# Patient Record
Sex: Male | Born: 1965 | Race: White | Hispanic: No | Marital: Married | Smoking: Never smoker
Health system: Southern US, Community
[De-identification: ages and names within clinical notes are randomized; demographics above are authoritative.]

## PROBLEM LIST (undated history)

## (undated) DIAGNOSIS — I480 Paroxysmal atrial fibrillation: Secondary | ICD-10-CM

## (undated) DIAGNOSIS — N200 Calculus of kidney: Secondary | ICD-10-CM

## (undated) DIAGNOSIS — I77810 Thoracic aortic ectasia: Secondary | ICD-10-CM

## (undated) DIAGNOSIS — F419 Anxiety disorder, unspecified: Secondary | ICD-10-CM

## (undated) DIAGNOSIS — I4891 Unspecified atrial fibrillation: Secondary | ICD-10-CM

## (undated) DIAGNOSIS — I10 Essential (primary) hypertension: Secondary | ICD-10-CM

## (undated) DIAGNOSIS — I499 Cardiac arrhythmia, unspecified: Secondary | ICD-10-CM

## (undated) DIAGNOSIS — R072 Precordial pain: Secondary | ICD-10-CM

## (undated) DIAGNOSIS — M4800 Spinal stenosis, site unspecified: Secondary | ICD-10-CM

## (undated) DIAGNOSIS — M1990 Unspecified osteoarthritis, unspecified site: Secondary | ICD-10-CM

## (undated) DIAGNOSIS — R079 Chest pain, unspecified: Secondary | ICD-10-CM

## (undated) DIAGNOSIS — I5189 Other ill-defined heart diseases: Secondary | ICD-10-CM

## (undated) HISTORY — DX: Thoracic aortic ectasia: I77.810

## (undated) HISTORY — DX: Unspecified atrial fibrillation: I48.91

## (undated) HISTORY — PX: KNEE SURGERY: SHX244

## (undated) HISTORY — DX: Precordial pain: R07.2

## (undated) HISTORY — DX: Chest pain, unspecified: R07.9

## (undated) HISTORY — DX: Anxiety disorder, unspecified: F41.9

## (undated) HISTORY — DX: Spinal stenosis, site unspecified: M48.00

## (undated) HISTORY — DX: Unspecified osteoarthritis, unspecified site: M19.90

## (undated) HISTORY — DX: Paroxysmal atrial fibrillation: I48.0

## (undated) HISTORY — DX: Other ill-defined heart diseases: I51.89

## (undated) HISTORY — DX: Cardiac arrhythmia, unspecified: I49.9

## (undated) HISTORY — DX: Calculus of kidney: N20.0

## (undated) HISTORY — DX: Essential (primary) hypertension: I10

---

## 1998-04-27 ENCOUNTER — Inpatient Hospital Stay (HOSPITAL_COMMUNITY): Admission: RE | Admit: 1998-04-27 | Discharge: 1998-04-28 | Payer: Self-pay | Admitting: Specialist

## 1998-08-16 ENCOUNTER — Ambulatory Visit (HOSPITAL_COMMUNITY): Admission: RE | Admit: 1998-08-16 | Discharge: 1998-08-16 | Payer: Self-pay | Admitting: Specialist

## 1998-08-16 ENCOUNTER — Encounter: Payer: Self-pay | Admitting: Specialist

## 1998-08-31 ENCOUNTER — Encounter: Admission: RE | Admit: 1998-08-31 | Discharge: 1998-11-29 | Payer: Self-pay | Admitting: Anesthesiology

## 1998-11-06 HISTORY — PX: BACK SURGERY: SHX140

## 2001-10-11 ENCOUNTER — Emergency Department (HOSPITAL_COMMUNITY): Admission: EM | Admit: 2001-10-11 | Discharge: 2001-10-11 | Payer: Self-pay

## 2005-07-20 ENCOUNTER — Emergency Department: Payer: Self-pay | Admitting: Emergency Medicine

## 2005-07-20 ENCOUNTER — Other Ambulatory Visit: Payer: Self-pay

## 2006-03-12 ENCOUNTER — Emergency Department: Payer: Self-pay | Admitting: Emergency Medicine

## 2006-03-12 ENCOUNTER — Other Ambulatory Visit: Payer: Self-pay

## 2006-03-21 ENCOUNTER — Ambulatory Visit: Payer: Self-pay | Admitting: Internal Medicine

## 2006-03-21 ENCOUNTER — Observation Stay (HOSPITAL_COMMUNITY): Admission: EM | Admit: 2006-03-21 | Discharge: 2006-03-22 | Payer: Self-pay | Admitting: Emergency Medicine

## 2006-03-26 ENCOUNTER — Ambulatory Visit: Payer: Self-pay | Admitting: Internal Medicine

## 2006-03-26 ENCOUNTER — Ambulatory Visit: Payer: Self-pay

## 2006-04-03 ENCOUNTER — Ambulatory Visit: Payer: Self-pay | Admitting: Internal Medicine

## 2006-04-12 ENCOUNTER — Ambulatory Visit: Payer: Self-pay | Admitting: Internal Medicine

## 2006-05-29 ENCOUNTER — Encounter (HOSPITAL_COMMUNITY): Admission: RE | Admit: 2006-05-29 | Discharge: 2006-07-19 | Payer: Self-pay | Admitting: Internal Medicine

## 2006-06-05 ENCOUNTER — Ambulatory Visit: Payer: Self-pay | Admitting: Cardiology

## 2007-01-09 ENCOUNTER — Emergency Department (HOSPITAL_COMMUNITY): Admission: EM | Admit: 2007-01-09 | Discharge: 2007-01-10 | Payer: Self-pay | Admitting: Emergency Medicine

## 2008-03-07 IMAGING — CT CT CHEST W/ CM
2 of 3 series · 16 of 36 positions shown, 20 images · non-contrast
Comparison: none

HISTORY: Mediastinal mass, prior abnormal exam, hypertension

[Series 2: chest_rout_xxl 5.0 b31f st · axial · 0.89mm/px · z∈[-362,-88]mm · 13 of 63 slices shown, 17 images]
[im 5/63  mediastinal]
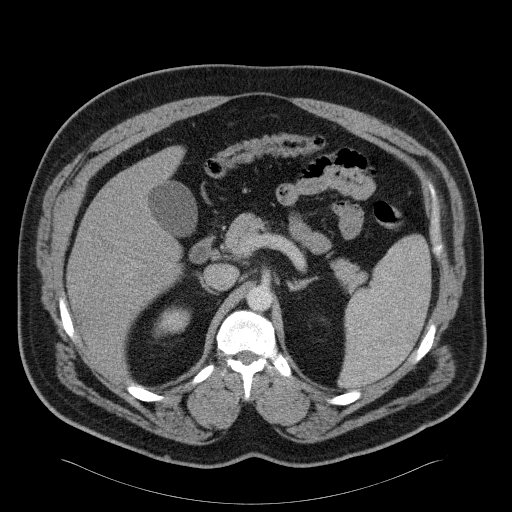
[im 5/63  lung]
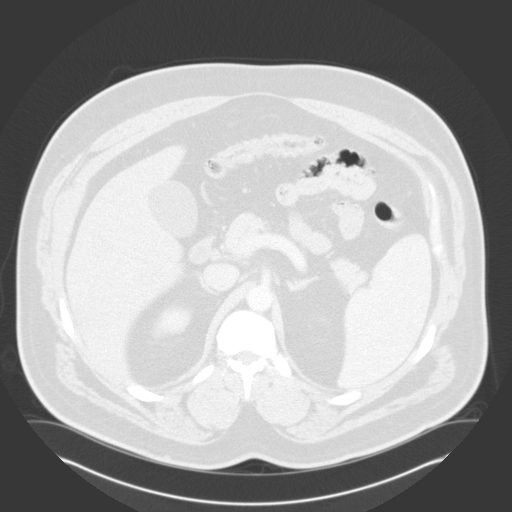
[im 10/63  lung]
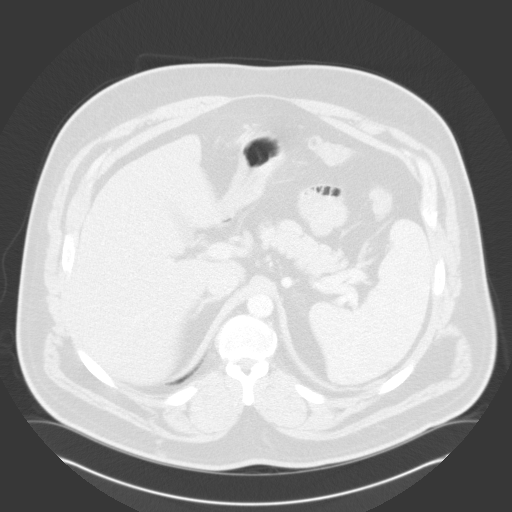
[im 14/63  lung]
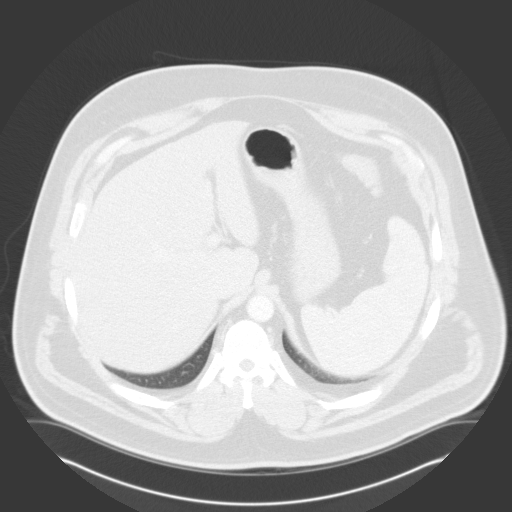
[im 19/63  lung]
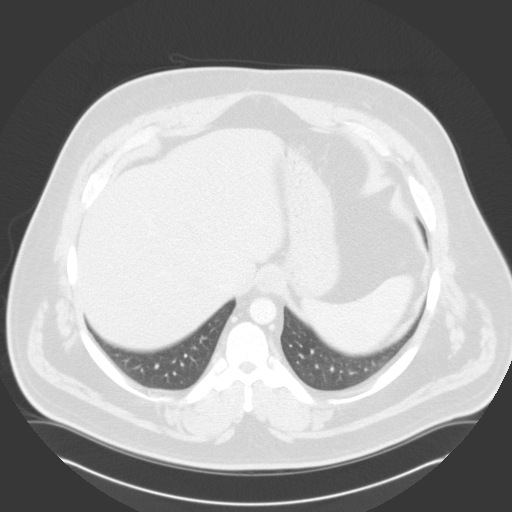
[im 23/63  mediastinal]
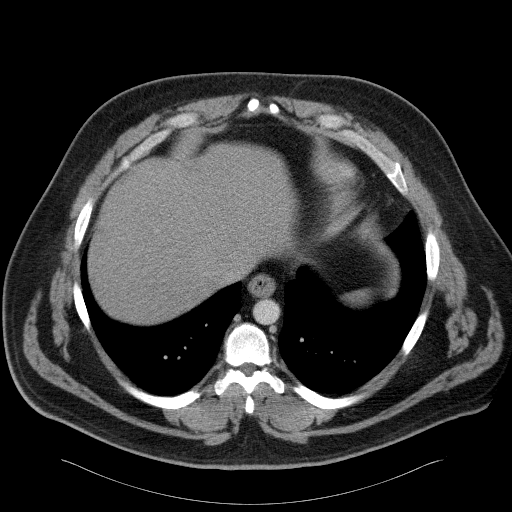
[im 23/63  lung]
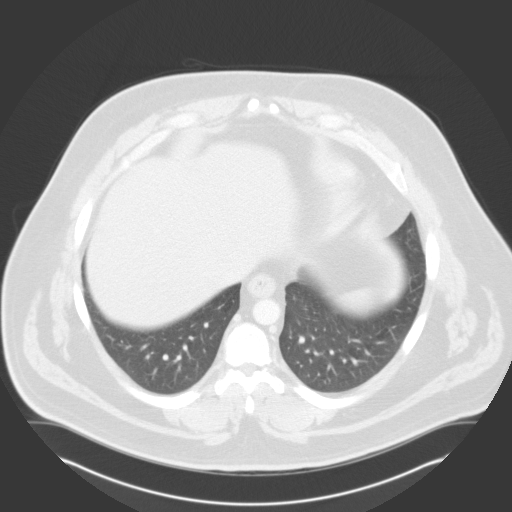
[im 28/63  lung]
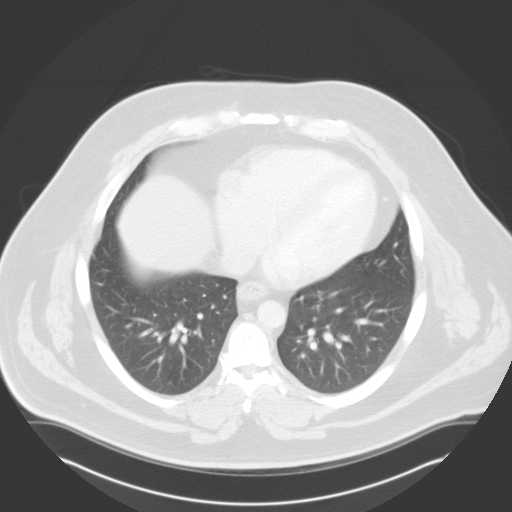
[im 33/63  lung]
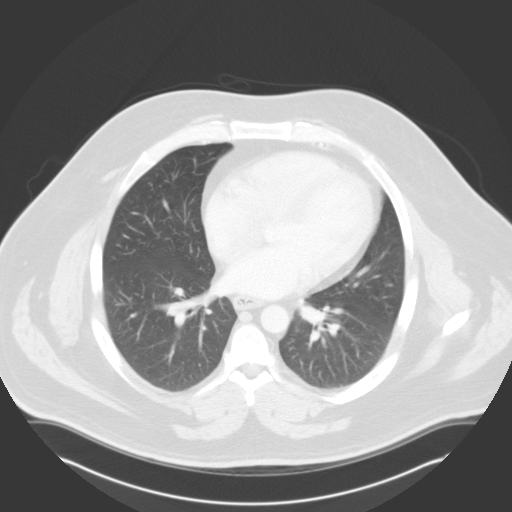
[im 37/63  lung]
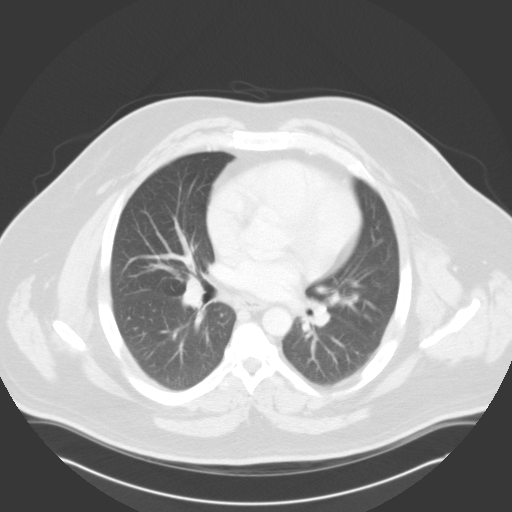
[im 42/63  mediastinal]
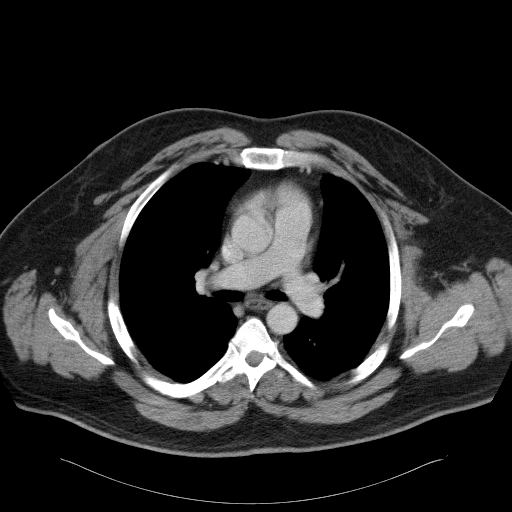
[im 42/63  lung]
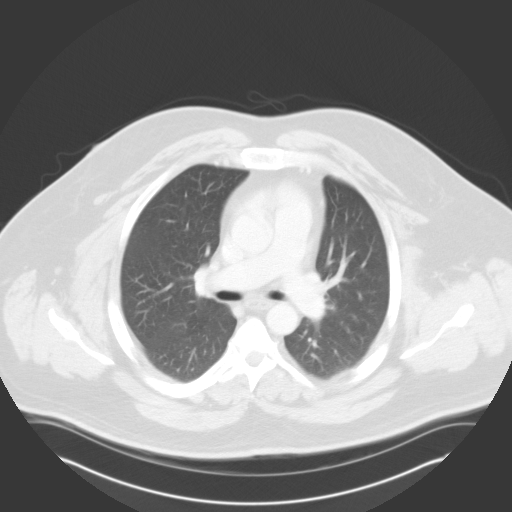
[im 46/63  lung]
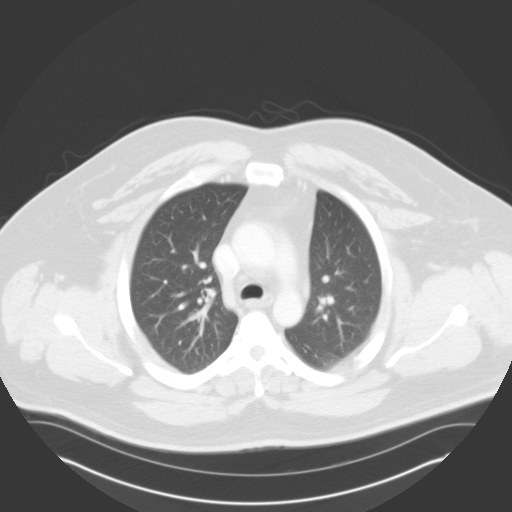
[im 51/63  lung]
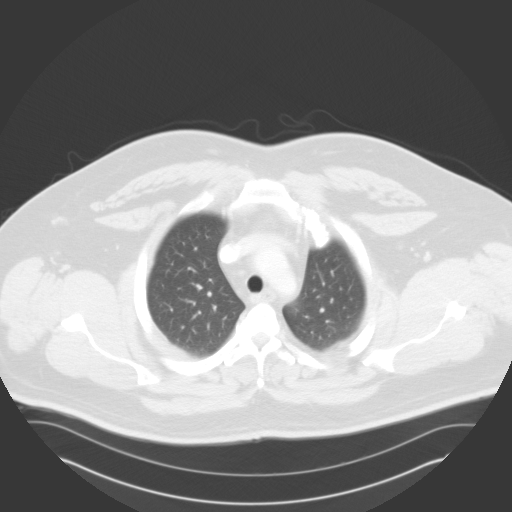
[im 56/63  lung]
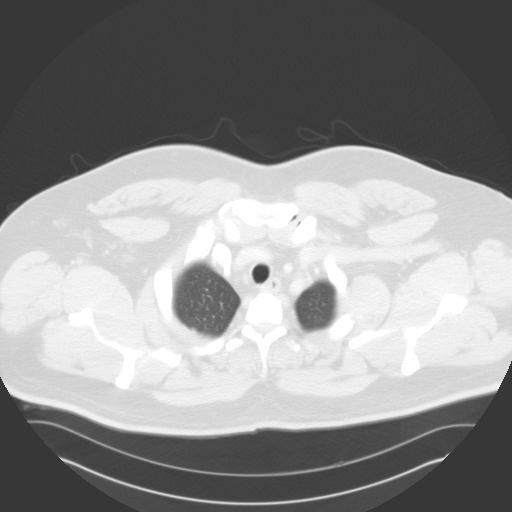
[im 60/63  mediastinal]
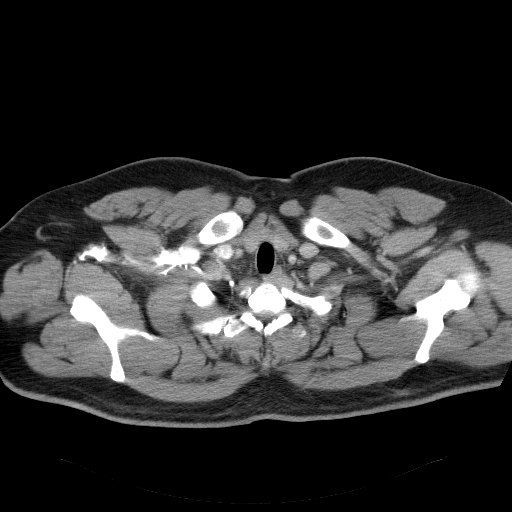
[im 60/63  lung]
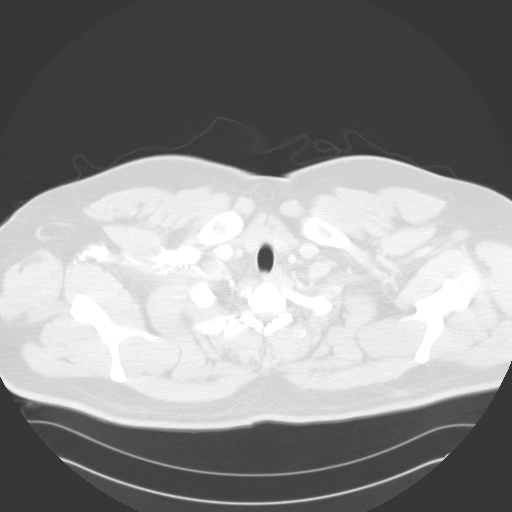

[Series 602: <mpr range> · coronal · 0.89mm/px · 3 of 39 slices shown]
[im 8/39  lung]
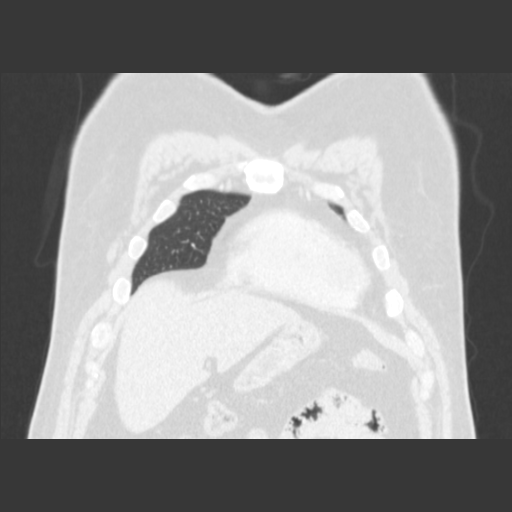
[im 16/39  lung]
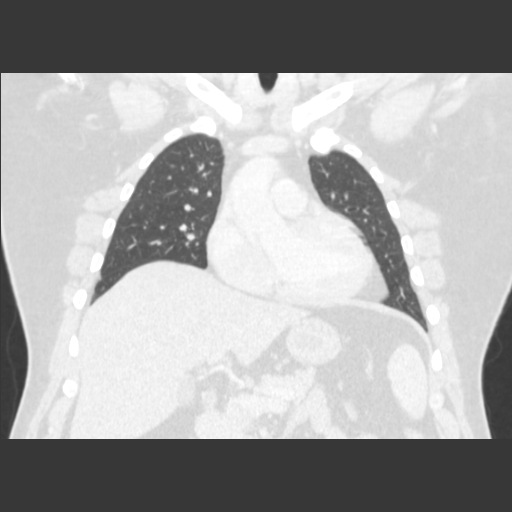
[im 23/39  lung]
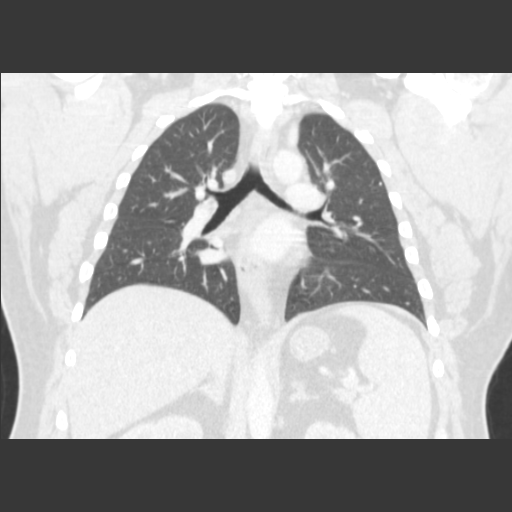

[16 of 36 positions shown; findings below may reference images not displayed]

CT CHEST WITH CONTRAST:

Multidetector helical CT imaging chest performed following 80 cc Imnipaque-O99.
No recent chest radiograph for correlation.
Exam correlated with earlier chest x-ray of 03/21/2006.
No prior CT chest for correlation.

Thoracic vascular structures grossly patent.
No thoracic adenopathy.
No evidence of mediastinal mass.
Lungs clear.
No mass, nausea, infiltrate, or pleural effusion.
Visualized portion of upper abdomen unremarkable though stomach and bowel loops
inadequately assessed due to lack of GI contrast.
IMPRESSION: No acute thoracic abnormalities.
If patient has a prior outside abnormal exam, recommend this be obtained for
direct correlation to better address the clinical concern.

## 2011-05-25 ENCOUNTER — Inpatient Hospital Stay: Payer: Self-pay | Admitting: Psychiatry

## 2011-06-02 ENCOUNTER — Emergency Department (HOSPITAL_COMMUNITY)
Admission: EM | Admit: 2011-06-02 | Discharge: 2011-06-03 | Disposition: A | Discharge status: Home / Self Care | Payer: Self-pay

## 2011-06-02 ENCOUNTER — Emergency Department (HOSPITAL_COMMUNITY): Payer: Self-pay

## 2011-06-02 DIAGNOSIS — M25569 Pain in unspecified knee: Secondary | ICD-10-CM | POA: Insufficient documentation

## 2011-06-02 DIAGNOSIS — R110 Nausea: Secondary | ICD-10-CM | POA: Insufficient documentation

## 2011-06-02 DIAGNOSIS — Y9389 Activity, other specified: Secondary | ICD-10-CM | POA: Insufficient documentation

## 2011-06-02 DIAGNOSIS — M545 Low back pain, unspecified: Secondary | ICD-10-CM | POA: Insufficient documentation

## 2011-06-02 DIAGNOSIS — M79609 Pain in unspecified limb: Secondary | ICD-10-CM | POA: Insufficient documentation

## 2011-06-02 DIAGNOSIS — M25519 Pain in unspecified shoulder: Secondary | ICD-10-CM | POA: Insufficient documentation

## 2011-06-02 DIAGNOSIS — M7989 Other specified soft tissue disorders: Secondary | ICD-10-CM | POA: Insufficient documentation

## 2011-06-02 DIAGNOSIS — S335XXA Sprain of ligaments of lumbar spine, initial encounter: Secondary | ICD-10-CM | POA: Insufficient documentation

## 2011-06-02 DIAGNOSIS — IMO0002 Reserved for concepts with insufficient information to code with codable children: Secondary | ICD-10-CM | POA: Insufficient documentation

## 2011-06-02 DIAGNOSIS — S60229A Contusion of unspecified hand, initial encounter: Secondary | ICD-10-CM | POA: Insufficient documentation

## 2011-06-02 DIAGNOSIS — Y9239 Other specified sports and athletic area as the place of occurrence of the external cause: Secondary | ICD-10-CM | POA: Insufficient documentation

## 2011-06-02 DIAGNOSIS — T148XXA Other injury of unspecified body region, initial encounter: Secondary | ICD-10-CM | POA: Insufficient documentation

## 2011-06-02 DIAGNOSIS — M25579 Pain in unspecified ankle and joints of unspecified foot: Secondary | ICD-10-CM | POA: Insufficient documentation

## 2011-06-02 DIAGNOSIS — R079 Chest pain, unspecified: Secondary | ICD-10-CM | POA: Insufficient documentation

## 2011-06-02 DIAGNOSIS — I10 Essential (primary) hypertension: Secondary | ICD-10-CM | POA: Insufficient documentation

## 2016-04-11 ENCOUNTER — Ambulatory Visit (INDEPENDENT_AMBULATORY_CARE_PROVIDER_SITE_OTHER): Payer: Self-pay | Admitting: Primary Care

## 2016-04-11 ENCOUNTER — Encounter: Payer: Self-pay | Admitting: Primary Care

## 2016-04-11 VITALS — BP 118/72 | HR 74 | Temp 97.9°F | Ht 67.5 in | Wt 278.1 lb

## 2016-04-11 DIAGNOSIS — L4050 Arthropathic psoriasis, unspecified: Secondary | ICD-10-CM | POA: Insufficient documentation

## 2016-04-11 DIAGNOSIS — I10 Essential (primary) hypertension: Secondary | ICD-10-CM | POA: Insufficient documentation

## 2016-04-11 DIAGNOSIS — Z791 Long term (current) use of non-steroidal anti-inflammatories (NSAID): Secondary | ICD-10-CM

## 2016-04-11 LAB — RHEUMATOID FACTOR: Rhuematoid fact SerPl-aCnc: <10 IU/mL (ref <=14)

## 2016-04-11 MED ORDER — MELOXICAM 15 MG PO TABS: 15.0000 mg | ORAL_TABLET | Freq: Every day | ORAL | Status: DC | PRN

## 2016-04-11 NOTE — Assessment & Plan Note (Addendum)
Diagnosed several years ago at [facility]. Exam with mild-moderate swelling to joints in hands and wrists. Inflammatory labs pending today. BMP pending. Meloxicam sent to pharmacy, discussed to stop other NSAIDS. Referral placed to rheumatology for further evaluation.

## 2016-04-11 NOTE — Progress Notes (Signed)
Subjective:    Patient ID: ODEN LINDAMAN, male    DOB: October 07, 1966, 50 y.o.   MRN: [id number]  HPI  Mr. Fei is a 50 year old male who presents today to establish care and discuss the problems mentioned below. Will obtain old records. His last physical was several years ago.  1) Essential Hypertension: Diagnosed 8-10 years ago. Currently managed on lisinopril-HCTZ 20/12.5 mg tablets. He denies chest pain, shortness of breath, dizziness.   2) Psoriatic Arthritis: Diagnosed years ago and once managed at [facility]. Pain located to neck, hands, wrists, knees, and plantar feet bilaterally. Over the past 3-4 months the pain to his knees and feet have been more bothersome. He also experiences swelling to his wrists and hands. His knee pain is located to the medial collateral ligaments of his knees.   He describes his pain to the plantar surface of his feet as tenderness and knotting. He's tried switching shoes, and is currently taking ibuprofen/aleve with improvement. Denies burning, recent injury/trauma, numbness/tingling, back pain. He works at a desk during the day at work.  He's had no recent labs or follow up in numerous years.  Review of Systems  Respiratory: Negative for shortness of breath.   Cardiovascular: Negative for chest pain.  Musculoskeletal: Positive for arthralgias and neck pain. Negative for back pain.  Neurological: Negative for dizziness, numbness and headaches.       Past Medical History  Diagnosis Date  . Arthritis   . Spinal stenosis   . Essential hypertension   . Kidney stone      Social History   Social History  . Marital Status: Married    Spouse Name: N/A  . Number of Children: N/A  . Years of Education: N/A   Occupational History  . Not on file.   Social History Main Topics  . Smoking status: Never Smoker   . Smokeless tobacco: Not on file  . Alcohol Use: 0.0 oz/week    0 Standard drinks or equivalent per week     Comment: occ  . Drug Use: No    . Sexual Activity: Not on file   Other Topics Concern  . Not on file   Social History Narrative   Married.   4 children.    Works in [employer].   Enjoys DJ's, playing in a band.     Past Surgical History  Procedure Laterality Date  . Back surgery  2000  . Knee surgery Right     Family History  Problem Relation Age of Onset  . Lung cancer Mother     No Known Allergies  No current outpatient prescriptions on file prior to visit.   No current facility-administered medications on file prior to visit.    BP 118/72 mmHg  Pulse 74  Temp(Src) 97.9 F (36.6 C) (Oral)  Ht 5' 7.5" (1.715 m)  Wt 278 lb 1.9 oz (126.154 kg)  BMI 42.89 kg/m2  SpO2 98%    Objective:   Physical Exam  Constitutional: He is oriented to person, place, and time. He appears well-nourished.  Neck: Neck supple.  Cardiovascular: Normal rate and regular rhythm.   Pulmonary/Chest: Effort normal and breath sounds normal. He has no wheezes. He has no rales.  Musculoskeletal:  Mild swelling to DIP and PIP joints of bilateral hands. Slight decrease in ROM to bilateral knees due to pain, no swelling. No obvious deformity.  Neurological: He is alert and oriented to person, place, and time.  Skin: Skin is warm and  dry.  Psychiatric: He has a normal mood and affect.          Assessment & Plan:

## 2016-04-11 NOTE — Progress Notes (Signed)
Pre visit review using our clinic review tool, if applicable. No additional management support is needed unless otherwise documented below in the visit note. 

## 2016-04-11 NOTE — Patient Instructions (Addendum)
Start Meloxicam 15 mg tablets for pain and inflammation. Take 1 tablet by mouth once daily as needed for pain and inflammation. Do not take aleve, ibuprofen, motrin with this medication.  Stop by the front desk and speak with either Shirlee Limerick or Revonda Standard regarding your referral to Rheumatology.  Purchase insoles and place inside of your shoes for support.   Please schedule a physical with me within the next 3-6 months. You may also schedule a lab only appointment 3-4 days prior. We will discuss your lab results in detail during your physical.  It was a pleasure to meet you today! Please don't hesitate to call me with any questions. Welcome to [facility]!

## 2016-04-11 NOTE — Assessment & Plan Note (Signed)
Diagnosed years ago, managed on lisinopril-hctz 20-12.5 mg. BP stable in the clinic today. Asymptomatic.

## 2016-04-12 LAB — BASIC METABOLIC PANEL
BUN: 20 mg/dL (ref 6–23)
CHLORIDE: 102 meq/L (ref 96–112)
CO2: 31 meq/L (ref 19–32)
Calcium: 9.5 mg/dL (ref 8.4–10.5)
Creatinine, Ser: 0.69 mg/dL (ref 0.40–1.50)
GFR: 129.69 mL/min (ref >60.00)
Glucose, Bld: 92 mg/dL (ref 70–99)
POTASSIUM: 4.1 meq/L (ref 3.5–5.1)
Sodium: 139 mEq/L (ref 135–145)

## 2016-04-12 LAB — SEDIMENTATION RATE: SED RATE: 46 mm/h — AB (ref 0–15)

## 2016-04-12 LAB — C-REACTIVE PROTEIN: CRP: 4.7 mg/dL (ref 0.5–20.0)

## 2016-04-13 LAB — ANA: ANA: POSITIVE — AB

## 2016-04-13 LAB — ANTI-NUCLEAR AB-TITER (ANA TITER): ANA Titer 1: 1:40 {titer} — ABNORMAL HIGH

## 2016-04-21 ENCOUNTER — Telehealth: Payer: Self-pay | Admitting: Primary Care

## 2016-04-21 NOTE — Telephone Encounter (Signed)
-----   Message from Doreene Nest, NP sent at 04/14/2016  3:47 PM EDT ----- Regarding: Rheumatology Has he heard from Rheumatology?

## 2016-04-21 NOTE — Telephone Encounter (Signed)
Message left for patient to return my call.  

## 2016-04-25 NOTE — Telephone Encounter (Deleted)
Sent patient a MyChart message

## 2016-04-25 NOTE — Telephone Encounter (Signed)
Patient has appointment on Wednesday 04/25/2016.

## 2016-04-26 DIAGNOSIS — L409 Psoriasis, unspecified: Secondary | ICD-10-CM | POA: Insufficient documentation

## 2016-04-26 DIAGNOSIS — M4800 Spinal stenosis, site unspecified: Secondary | ICD-10-CM | POA: Insufficient documentation

## 2016-05-15 DIAGNOSIS — M159 Polyosteoarthritis, unspecified: Secondary | ICD-10-CM | POA: Insufficient documentation

## 2016-09-23 ENCOUNTER — Emergency Department: Payer: Self-pay

## 2016-09-23 ENCOUNTER — Inpatient Hospital Stay
Admission: EM | Admit: 2016-09-23 | Discharge: 2016-09-24 | DRG: 309 | Disposition: A | Discharge status: Home / Self Care | Payer: Self-pay | Attending: Internal Medicine | Admitting: Internal Medicine

## 2016-09-23 ENCOUNTER — Encounter: Payer: Self-pay | Admitting: Emergency Medicine

## 2016-09-23 ENCOUNTER — Inpatient Hospital Stay
Admit: 2016-09-23 | Discharge: 2016-09-23 | Disposition: A | Discharge status: Home / Self Care | Payer: Self-pay | Attending: Internal Medicine | Admitting: Internal Medicine

## 2016-09-23 DIAGNOSIS — I10 Essential (primary) hypertension: Secondary | ICD-10-CM

## 2016-09-23 DIAGNOSIS — Z6841 Body Mass Index (BMI) 40.0 and over, adult: Secondary | ICD-10-CM

## 2016-09-23 DIAGNOSIS — M1990 Unspecified osteoarthritis, unspecified site: Secondary | ICD-10-CM | POA: Diagnosis present

## 2016-09-23 DIAGNOSIS — Z23 Encounter for immunization: Secondary | ICD-10-CM

## 2016-09-23 DIAGNOSIS — Z8249 Family history of ischemic heart disease and other diseases of the circulatory system: Secondary | ICD-10-CM

## 2016-09-23 DIAGNOSIS — E782 Mixed hyperlipidemia: Secondary | ICD-10-CM | POA: Diagnosis present

## 2016-09-23 DIAGNOSIS — M069 Rheumatoid arthritis, unspecified: Secondary | ICD-10-CM | POA: Diagnosis present

## 2016-09-23 DIAGNOSIS — Z79899 Other long term (current) drug therapy: Secondary | ICD-10-CM

## 2016-09-23 DIAGNOSIS — E876 Hypokalemia: Secondary | ICD-10-CM | POA: Diagnosis present

## 2016-09-23 DIAGNOSIS — R0789 Other chest pain: Secondary | ICD-10-CM | POA: Diagnosis present

## 2016-09-23 DIAGNOSIS — M4800 Spinal stenosis, site unspecified: Secondary | ICD-10-CM | POA: Diagnosis present

## 2016-09-23 DIAGNOSIS — L4050 Arthropathic psoriasis, unspecified: Secondary | ICD-10-CM

## 2016-09-23 DIAGNOSIS — Z7982 Long term (current) use of aspirin: Secondary | ICD-10-CM

## 2016-09-23 DIAGNOSIS — H6691 Otitis media, unspecified, right ear: Secondary | ICD-10-CM | POA: Diagnosis present

## 2016-09-23 DIAGNOSIS — G4730 Sleep apnea, unspecified: Secondary | ICD-10-CM | POA: Diagnosis present

## 2016-09-23 DIAGNOSIS — Z801 Family history of malignant neoplasm of trachea, bronchus and lung: Secondary | ICD-10-CM

## 2016-09-23 DIAGNOSIS — I4891 Unspecified atrial fibrillation: Principal | ICD-10-CM | POA: Diagnosis present

## 2016-09-23 DIAGNOSIS — Z87442 Personal history of urinary calculi: Secondary | ICD-10-CM

## 2016-09-23 DIAGNOSIS — R079 Chest pain, unspecified: Secondary | ICD-10-CM | POA: Diagnosis present

## 2016-09-23 LAB — CBC
HCT: 39 % — ABNORMAL LOW (ref 40.0–52.0)
HEMOGLOBIN: 13.4 g/dL (ref 13.0–18.0)
MCH: 28 pg (ref 26.0–34.0)
MCHC: 34.5 g/dL (ref 32.0–36.0)
MCV: 81.2 fL (ref 80.0–100.0)
Platelets: 256 10*3/uL (ref 150–440)
RBC: 4.8 MIL/uL (ref 4.40–5.90)
RDW: 14.2 % (ref 11.5–14.5)
WBC: 11.3 10*3/uL — AB (ref 3.8–10.6)

## 2016-09-23 LAB — BASIC METABOLIC PANEL
ANION GAP: 8 (ref 5–15)
ANION GAP: 6 (ref 5–15)
BUN: 17 mg/dL (ref 6–20)
BUN: 14 mg/dL (ref 6–20)
CHLORIDE: 106 mmol/L (ref 101–111)
CHLORIDE: 109 mmol/L (ref 101–111)
CO2: 25 mmol/L (ref 22–32)
CO2: 26 mmol/L (ref 22–32)
Calcium: 9 mg/dL (ref 8.9–10.3)
Calcium: 8.7 mg/dL — ABNORMAL LOW (ref 8.9–10.3)
Creatinine, Ser: 0.79 mg/dL (ref 0.61–1.24)
Creatinine, Ser: 0.71 mg/dL (ref 0.61–1.24)
GFR calc non Af Amer: >60 mL/min (ref >60)
GFR calc non Af Amer: >60 mL/min (ref >60)
GLUCOSE: 107 mg/dL — AB (ref 65–99)
Glucose, Bld: 137 mg/dL — ABNORMAL HIGH (ref 65–99)
POTASSIUM: 3.8 mmol/L (ref 3.5–5.1)
Potassium: 3.2 mmol/L — ABNORMAL LOW (ref 3.5–5.1)
SODIUM: 139 mmol/L (ref 135–145)
Sodium: 141 mmol/L (ref 135–145)

## 2016-09-23 LAB — TROPONIN I
Troponin I: <0.03 ng/mL (ref <0.03)
Troponin I: <0.03 ng/mL (ref <0.03)

## 2016-09-23 LAB — MRSA PCR SCREENING: MRSA by PCR: POSITIVE — AB

## 2016-09-23 LAB — GLUCOSE, CAPILLARY: Glucose-Capillary: 94 mg/dL (ref 65–99)

## 2016-09-23 MED ORDER — ENOXAPARIN SODIUM 150 MG/ML ~~LOC~~ SOLN
140.0000 mg | Freq: Two times a day (BID) | SUBCUTANEOUS | Status: DC
  Administered 2016-09-23 (×2): 140 mg via SUBCUTANEOUS
  Filled 2016-09-23 (×4): qty 0.93

## 2016-09-23 MED ORDER — SODIUM CHLORIDE 0.9 % IV SOLN: 250.0000 mL | INTRAVENOUS | Status: DC | PRN

## 2016-09-23 MED ORDER — DILTIAZEM HCL ER COATED BEADS 180 MG PO CP24: 180.0000 mg | ORAL_CAPSULE | Freq: Every day | ORAL | Status: DC

## 2016-09-23 MED ORDER — METOPROLOL TARTRATE 50 MG PO TABS
50.0000 mg | ORAL_TABLET | Freq: Two times a day (BID) | ORAL | Status: DC
  Administered 2016-09-23: 50 mg via ORAL
  Filled 2016-09-23: qty 1

## 2016-09-23 MED ORDER — ASPIRIN 81 MG PO CHEW
81.0000 mg | CHEWABLE_TABLET | Freq: Every day | ORAL | Status: DC
  Administered 2016-09-23 – 2016-09-24 (×2): 81 mg via ORAL
  Filled 2016-09-23 (×2): qty 1

## 2016-09-23 MED ORDER — SODIUM CHLORIDE 0.9% FLUSH: 3.0000 mL | INTRAVENOUS | Status: DC | PRN

## 2016-09-23 MED ORDER — METOPROLOL TARTRATE 25 MG PO TABS: 25.0000 mg | ORAL_TABLET | Freq: Two times a day (BID) | ORAL | Status: DC

## 2016-09-23 MED ORDER — MULTIVITAMINS PO CAPS: 1.0000 | ORAL_CAPSULE | Freq: Every day | ORAL | Status: DC

## 2016-09-23 MED ORDER — DILTIAZEM HCL 25 MG/5ML IV SOLN
INTRAVENOUS | Status: AC
  Filled 2016-09-23: qty 5

## 2016-09-23 MED ORDER — METOPROLOL TARTRATE 25 MG PO TABS
25.0000 mg | ORAL_TABLET | Freq: Three times a day (TID) | ORAL | Status: DC
  Administered 2016-09-23: 25 mg via ORAL
  Filled 2016-09-23: qty 1

## 2016-09-23 MED ORDER — DILTIAZEM HCL 100 MG IV SOLR
5.0000 mg/h | INTRAVENOUS | Status: DC
  Administered 2016-09-23: 10 mg/h via INTRAVENOUS
  Filled 2016-09-23: qty 100

## 2016-09-23 MED ORDER — POTASSIUM CHLORIDE CRYS ER 20 MEQ PO TBCR
40.0000 meq | EXTENDED_RELEASE_TABLET | Freq: Once | ORAL | Status: AC
  Administered 2016-09-23: 40 meq via ORAL
  Filled 2016-09-23: qty 2

## 2016-09-23 MED ORDER — SENNOSIDES-DOCUSATE SODIUM 8.6-50 MG PO TABS: 1.0000 | ORAL_TABLET | Freq: Every evening | ORAL | Status: DC | PRN

## 2016-09-23 MED ORDER — DILTIAZEM HCL ER COATED BEADS 180 MG PO CP24
180.0000 mg | ORAL_CAPSULE | Freq: Once | ORAL | Status: AC
  Administered 2016-09-23: 180 mg via ORAL

## 2016-09-23 MED ORDER — DILTIAZEM HCL 25 MG/5ML IV SOLN
20.0000 mg | Freq: Once | INTRAVENOUS | Status: AC
  Administered 2016-09-23: 20 mg via INTRAVENOUS

## 2016-09-23 MED ORDER — ONDANSETRON HCL 4 MG PO TABS: 4.0000 mg | ORAL_TABLET | Freq: Four times a day (QID) | ORAL | Status: DC | PRN

## 2016-09-23 MED ORDER — ONDANSETRON HCL 4 MG/2ML IJ SOLN
4.0000 mg | Freq: Four times a day (QID) | INTRAMUSCULAR | Status: DC | PRN
Start: 2016-09-23 — End: 2016-09-24

## 2016-09-23 MED ORDER — SODIUM CHLORIDE 0.9% FLUSH
3.0000 mL | Freq: Two times a day (BID) | INTRAVENOUS | Status: DC
  Administered 2016-09-23 (×2): 3 mL via INTRAVENOUS

## 2016-09-23 MED ORDER — PERFLUTREN LIPID MICROSPHERE
1.0000 mL | INTRAVENOUS | Status: AC | PRN
  Administered 2016-09-23: 5 mL via INTRAVENOUS
  Filled 2016-09-23: qty 10

## 2016-09-23 MED ORDER — MUPIROCIN 2 % EX OINT
TOPICAL_OINTMENT | Freq: Two times a day (BID) | CUTANEOUS | Status: DC
  Administered 2016-09-23 – 2016-09-24 (×3): via NASAL
  Filled 2016-09-23: qty 22

## 2016-09-23 MED ORDER — DILTIAZEM HCL 25 MG/5ML IV SOLN
INTRAVENOUS | Status: AC
  Filled 2016-09-23: qty 10

## 2016-09-23 MED ORDER — LISINOPRIL 20 MG PO TABS
20.0000 mg | ORAL_TABLET | Freq: Every day | ORAL | Status: DC
  Administered 2016-09-23: 20 mg via ORAL
  Filled 2016-09-23: qty 1

## 2016-09-23 MED ORDER — DILTIAZEM HCL 100 MG IV SOLR
5.0000 mg/h | Freq: Once | INTRAVENOUS | Status: AC
  Administered 2016-09-23: 5 mg/h via INTRAVENOUS
  Filled 2016-09-23: qty 100

## 2016-09-23 MED ORDER — DILTIAZEM HCL 25 MG/5ML IV SOLN
10.0000 mg | Freq: Once | INTRAVENOUS | Status: AC
  Administered 2016-09-23: 10 mg via INTRAVENOUS

## 2016-09-23 MED ORDER — INFLUENZA VAC SPLIT QUAD 0.5 ML IM SUSY
0.5000 mL | PREFILLED_SYRINGE | INTRAMUSCULAR | Status: AC
  Administered 2016-09-24: 0.5 mL via INTRAMUSCULAR
  Filled 2016-09-23: qty 0.5

## 2016-09-23 MED ORDER — ACETAMINOPHEN 325 MG PO TABS
650.0000 mg | ORAL_TABLET | Freq: Four times a day (QID) | ORAL | Status: DC | PRN
  Administered 2016-09-23 – 2016-09-24 (×2): 650 mg via ORAL
  Filled 2016-09-23 (×2): qty 2

## 2016-09-23 MED ORDER — MAGNESIUM SULFATE 2 GM/50ML IV SOLN
2.0000 g | Freq: Once | INTRAVENOUS | Status: AC
  Administered 2016-09-23: 2 g via INTRAVENOUS
  Filled 2016-09-23: qty 50

## 2016-09-23 MED ORDER — ADULT MULTIVITAMIN W/MINERALS CH
1.0000 | ORAL_TABLET | Freq: Every day | ORAL | Status: DC
  Administered 2016-09-23 – 2016-09-24 (×2): 1 via ORAL
  Filled 2016-09-23 (×2): qty 1

## 2016-09-23 MED ORDER — DILTIAZEM HCL 30 MG PO TABS: 30.0000 mg | ORAL_TABLET | Freq: Four times a day (QID) | ORAL | Status: DC

## 2016-09-23 NOTE — Plan of Care (Signed)
Pt converted to NSR 1610hr. Cardiologist made aware

## 2016-09-23 NOTE — ED Provider Notes (Signed)
[facility] Emergency Department Provider Note  ____________________________________________  Time seen: 12:49 AM  I have reviewed the triage vital signs and the nursing notes.   HISTORY  Chief Complaint Chest Pain     HPI Bradley Powers is a 50 y.o. male with below listed chronic medical conditions presents to the emergency department with acute onset of chest pressure approximately one hour before presentation to the emergency department. Patient also admits to rapid irregular heart rate.    Past Medical History:  Diagnosis Date  . Arthritis   . Essential hypertension   . Kidney stone   . Spinal stenosis     Patient Active Problem List   Diagnosis Date Noted  . A-fib (HCC) 09/23/2016  . Chest pain 09/23/2016  . Psoriatic arthritis (HCC) 04/11/2016  . Essential hypertension 04/11/2016    Past Surgical History:  Procedure Laterality Date  . BACK SURGERY  2000  . KNEE SURGERY Right     Current Outpatient Rx  . Order #: [id number] Class: Historical Med  . Order #: [id number] Class: Historical Med  . Order #: [id number] Class: Historical Med  . Order #: [id number] Class: Historical Med  . Order #: [id number] Class: Historical Med    Allergies No known drug allergies  Family History  Problem Relation Age of Onset  . Lung cancer Mother   . Heart attack Father     Social History Social History  Substance Use Topics  . Smoking status: Never Smoker  . Smokeless tobacco: Never Used  . Alcohol use 0.0 oz/week     Comment: occ    Review of Systems  Constitutional: Negative for fever. Eyes: Negative for visual changes. ENT: Negative for sore throat. Cardiovascular: Negative for chest pain. Respiratory: Negative for shortness of breath. Gastrointestinal: Negative for abdominal pain, vomiting and diarrhea. Genitourinary: Negative for dysuria. Musculoskeletal: Negative for back pain. Skin: Negative for rash. Neurological: Negative for  headaches, focal weakness or numbness.   10-point ROS otherwise negative.  ____________________________________________   PHYSICAL EXAM:  VITAL SIGNS: ED Triage Vitals  Enc Vitals Group     BP 09/23/16 0111 (!) 155/93     Pulse Rate 09/23/16 0125 (!) 124     Resp 09/23/16 0111 (!) 25     Temp --      Temp src --      SpO2 09/23/16 0125 97 %     Weight --      Height --      Head Circumference --      Peak Flow --      Pain Score 09/23/16 0044 9     Pain Loc --      Pain Edu? --      Excl. in GC? --     Constitutional: Alert and oriented. Well appearing and in no distress. Eyes: Conjunctivae are normal. PERRL. Normal extraocular movements. ENT   Head: Normocephalic and atraumatic.   Nose: No congestion/rhinnorhea.   Mouth/Throat: Mucous membranes are moist.   Neck: No stridor. Hematological/Lymphatic/Immunilogical: No cervical lymphadenopathy. Cardiovascular: Tachycardia with irregular regular rhythm. Irregular distal pulses are present in all extremities. No murmurs, rubs, or gallops. Respiratory: Normal respiratory effort without tachypnea nor retractions. Breath sounds are clear and equal bilaterally. No wheezes/rales/rhonchi. Gastrointestinal: Soft and nontender. No distention. There is no CVA tenderness. Genitourinary: deferred Musculoskeletal: Nontender with normal range of motion in all extremities. No joint effusions.  No lower extremity tenderness nor edema. Neurologic:  Normal speech and language. No gross focal neurologic deficits are  appreciated. Speech is normal.  Skin:  Skin is warm, dry and intact. No rash noted. Psychiatric: Mood and affect are normal. Speech and behavior are normal. Patient exhibits appropriate insight and judgment.  ____________________________________________    LABS (pertinent positives/negatives)  Labs Reviewed  BASIC METABOLIC PANEL - Abnormal; Notable for the following:       Result Value   Potassium 3.2 (*)     Glucose, Bld 137 (*)    All other components within normal limits  CBC - Abnormal; Notable for the following:    WBC 11.3 (*)    HCT 39.0 (*)    All other components within normal limits  TROPONIN I  THYROID PANEL WITH TSH     ____________________________________________   EKG  ED ECG REPORT I, Ingram N Shahida Schnackenberg, the attending physician, personally viewed and interpreted this ECG.   Date: 09/23/2016  EKG Time: 12:51AM  Rate: 136  Rhythm: Atrial fibrillation with rapid ventricular response  Axis: Normal  Intervals: Normal  ST&T Change: None   ____________________________________________    RADIOLOGY  CLINICAL DATA:  Acute onset of generalized chest pressure. Initial encounter.  EXAM: CHEST  2 VIEW  COMPARISON:  Chest radiograph performed 06/02/2011  FINDINGS: The lungs are well-aerated. Mild vascular congestion is noted. There is no evidence of focal opacification, pleural effusion or pneumothorax.  The heart is normal in size; the mediastinal contour is within normal limits. No acute osseous abnormalities are seen.  IMPRESSION: Mild vascular congestion noted.  Lungs remain grossly clear.   Electronically Signed   By: Roanna Raider M.D.   On: 09/23/2016 01:24    Procedures   Critical care:CRITICAL CARE Performed by: Darci Current   Total critical care time: 40 minutes  Critical care time was exclusive of separately billable procedures and treating other patients.  Critical care was necessary to treat or prevent imminent or life-threatening deterioration.  Critical care was time spent personally by me on the following activities: development of treatment plan with patient and/or surrogate as well as nursing, discussions with consultants, evaluation of patient's response to treatment, examination of patient, obtaining history from patient or surrogate, ordering and performing treatments and interventions, ordering and review of laboratory  studies, ordering and review of radiographic studies, pulse oximetry and re-evaluation of patient's condition.   INITIAL IMPRESSION / ASSESSMENT AND PLAN / ED COURSE  Pertinent labs & imaging results that were available during my care of the patient were reviewed by me and considered in my medical decision making (see chart for details).  Diltiazem boluses given without resolution of atrial fibrillation with rapid ventricular response as such a diltiazem drip initiated with rate control achieved however atrial fibrillation persisted.  ____________________________________________   FINAL CLINICAL IMPRESSION(S) / ED DIAGNOSES  Final diagnoses:  Atrial fibrillation with rapid ventricular response (HCC)  Chest pain, unspecified type      Darci Current, MD 09/23/16 0430

## 2016-09-23 NOTE — Plan of Care (Signed)
Problem: Skin Integrity: Goal: Risk for impaired skin integrity will decrease Outcome: Progressing BP 94/68   Pulse 77   Temp (!) 96.9 F (36.1 C) (Oral)   Resp 20   SpO2 96%    Cont afib on the cardizem drip

## 2016-09-23 NOTE — Progress Notes (Signed)
*  PRELIMINARY RESULTS* Echocardiogram 2D Echocardiogram has been performed. Definity used on this Echo.  Bradley Powers 09/23/2016, 3:05 PM

## 2016-09-23 NOTE — Progress Notes (Signed)
Pt arrive overnight with chest pain. Family will arrive later this morning. CH offered prayer.   09/23/16 0720  Clinical Encounter Type  Visited With Patient  Visit Type Initial;Spiritual support  Referral From Nurse  Spiritual Encounters  Spiritual Needs Prayer;Emotional  Stress Factors  Patient Stress Factors None identified

## 2016-09-23 NOTE — Plan of Care (Signed)
Paged MD to notify the positive mrsa of the nares

## 2016-09-23 NOTE — Progress Notes (Signed)
Patient ID: Bradley Powers, male   DOB: May 21, 1966, 50 y.o.   MRN: [id number]  [facility] PROGRESS NOTE  Bradley Powers XBM:[id number] DOB: 1965-12-02 DOA: 09/23/2016 PCP: Morrie Sheldon, NP  HPI/Subjective: Patient admitted with chest pain and palpitations. Found to be in rapid atrial fibrillation. Patient feeling better now with heart rate better controlled.  Objective: Vitals:   09/23/16 1000 09/23/16 1328  BP: 94/68 125/81  Pulse: 77   Resp: 20   Temp:      There were no vitals filed for this visit.  ROS: Review of Systems  Constitutional: Negative for chills and fever.  Eyes: Negative for blurred vision.  Respiratory: Negative for cough and shortness of breath.   Cardiovascular: Positive for chest pain and palpitations.  Gastrointestinal: Negative for abdominal pain, constipation, diarrhea, nausea and vomiting.  Genitourinary: Negative for dysuria.  Musculoskeletal: Positive for back pain, joint pain and neck pain.  Neurological: Negative for dizziness and headaches.   Exam: Physical Exam  Constitutional: He is oriented to person, place, and time.  HENT:  Nose: No mucosal edema.  Mouth/Throat: No oropharyngeal exudate or posterior oropharyngeal edema.  Eyes: Conjunctivae, EOM and lids are normal. Pupils are equal, round, and reactive to light.  Neck: No JVD present. Carotid bruit is not present. No edema present. No thyroid mass and no thyromegaly present.  Cardiovascular: S1 normal and S2 normal.  An irregularly irregular rhythm present. Exam reveals no gallop.   No murmur heard. Pulses:      Dorsalis pedis pulses are 2+ on the right side, and 2+ on the left side.  Respiratory: No respiratory distress. He has no decreased breath sounds. He has no wheezes. He has no rhonchi. He has no rales.  GI: Soft. Bowel sounds are normal. There is no tenderness.  Musculoskeletal:       Right ankle: He exhibits swelling.       Left ankle: He exhibits swelling.   Lymphadenopathy:    He has no cervical adenopathy.  Neurological: He is alert and oriented to person, place, and time. No cranial nerve deficit.  Skin: Skin is warm. No rash noted. Nails show no clubbing.  Psychiatric: He has a normal mood and affect.      Data Reviewed: Basic Metabolic Panel:  Recent Labs Lab 09/23/16 0106 09/23/16 0602  NA 139 141  K 3.2* 3.8  CL 106 109  CO2 25 26  GLUCOSE 137* 107*  BUN 17 14  CREATININE 0.79 0.71  CALCIUM 9.0 8.7*   CBC:  Recent Labs Lab 09/23/16 0106  WBC 11.3*  HGB 13.4  HCT 39.0*  MCV 81.2  PLT 256   Cardiac Enzymes:  Recent Labs Lab 09/23/16 0106 09/23/16 0602 09/23/16 1211  TROPONINI <0.03 <0.03 <0.03    CBG:  Recent Labs Lab 09/23/16 0546  GLUCAP 94    Recent Results (from the past 240 hour(s))  MRSA PCR Screening     Status: Abnormal   Collection Time: 09/23/16  5:43 AM  Result Value Ref Range Status   MRSA by PCR POSITIVE (A) NEGATIVE Final    Comment:        The GeneXpert MRSA Assay (FDA approved for NASAL specimens only), is one component of a comprehensive MRSA colonization surveillance program. It is not intended to diagnose MRSA infection nor to guide or monitor treatment for MRSA infections. RESULT CALLED TO, READ BACK BY AND VERIFIED WITH: CAROLINE SOGOMO ON 09/23/16 AT 0721 BY [facility]  Studies: Dg Chest 2 View  Result Date: 09/23/2016 CLINICAL DATA:  Acute onset of generalized chest pressure. Initial encounter. EXAM: CHEST  2 VIEW COMPARISON:  Chest radiograph performed 06/02/2011 FINDINGS: The lungs are well-aerated. Mild vascular congestion is noted. There is no evidence of focal opacification, pleural effusion or pneumothorax. The heart is normal in size; the mediastinal contour is within normal limits. No acute osseous abnormalities are seen. IMPRESSION: Mild vascular congestion noted.  Lungs remain grossly clear. Electronically Signed   By: Roanna Raider M.D.   On: 09/23/2016  01:24    Scheduled Meds: . aspirin  81 mg Oral Daily  . enoxaparin (LOVENOX) injection  140 mg Subcutaneous BID  . [START ON 09/24/2016] Influenza vac split quadrivalent PF  0.5 mL Intramuscular Tomorrow-1000  . lisinopril  20 mg Oral Daily  . metoprolol tartrate  25 mg Oral BID  . multivitamin with minerals  1 tablet Oral Daily  . mupirocin ointment   Nasal BID  . sodium chloride flush  3 mL Intravenous Q12H  . sodium chloride flush  3 mL Intravenous Q12H    Assessment/Plan:  1. Atrial fibrillation with rapid ventricular response. Taper off Cardizem drip. Oral Cardizem and oral metoprolol started. Patient has a [place] vas score of 1. Continue aspirin for now. Admitting physician put on Lovenox injections. Await cardiology consultation. Echocardiogram pending results. 2. Essential hypertension. On Cardizem and metoprolol. Hold lisinopril for now 3. Rheumatoid arthritis, psoriatic arthritis and osteoarthritis. All right to ambulate.  Code Status:     Code Status Orders        Start     Ordered   09/23/16 0250  Full code  Continuous     09/23/16 0250    Code Status History    Date Active Date Inactive Code Status Order ID Comments User Context   This patient has a current code status but no historical code status.     Family Communication: Family at bedside Disposition Plan: Potentially home soon  Consultants:  Cardiology  Time spent: 35 minutes  Alford Highland  [employer]

## 2016-09-23 NOTE — H&P (Addendum)
[facility] [place] [facility]   PATIENT NAME: Bradley Powers    MR#:  [id number]  DATE OF BIRTH:  1965-12-25  DATE OF ADMISSION:  09/23/2016  PRIMARY CARE PHYSICIAN: Morrie Sheldon, NP   REQUESTING/REFERRING PHYSICIAN:   CHIEF COMPLAINT:   Chief Complaint  Patient presents with  . Chest Pain    HISTORY OF PRESENT ILLNESS: Bradley Powers  is a 50 y.o. male with a known history of Hypertension, spinal stenosis, nephrolithiasis, arthritis presented to the emergency room with funny feeling in the chest and tightness. Patient also felt his heart was racing and palpitations. 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 Patient was brought to the emergency room by his friend and he was evaluated in the emergency room and was found to be in atrial fibrillation and rapid rate. Patient never had any atrial fibrillation in the past. Patient was given IV Cardizem push a total of 30 mg. His heart rate came down to 100 bpm. Patient does not have any risk factors except for high blood pressure. No complaints of shortness of breath. No history of orthopnea or proximal nocturnal dyspnea. No history of headache dizziness or blurry vision. No syncope or seizure.  PAST MEDICAL HISTORY:   Past Medical History:  Diagnosis Date  . Arthritis   . Essential hypertension   . Kidney stone   . Spinal stenosis     PAST SURGICAL HISTORY: Past Surgical History:  Procedure Laterality Date  . BACK SURGERY  2000  . KNEE SURGERY Right     SOCIAL HISTORY:  Social History  Substance Use Topics  . Smoking status: Never Smoker  . Smokeless tobacco: Never Used  . Alcohol use 0.0 oz/week     Comment: occ    FAMILY HISTORY:  Family History  Problem Relation Age of Onset  . Lung cancer Mother   . Heart attack Father     DRUG ALLERGIES: No Known Allergies  REVIEW OF SYSTEMS:   CONSTITUTIONAL: No fever, fatigue  or weakness.  EYES: No blurred or double vision.  EARS, NOSE, AND THROAT: No tinnitus or ear pain.  RESPIRATORY: No cough, shortness of breath, wheezing or hemoptysis.  CARDIOVASCULAR: Has chest tightness, palpitations No orthopnea, edema.  GASTROINTESTINAL: No nausea, vomiting, diarrhea or abdominal pain.  GENITOURINARY: No dysuria, hematuria.  ENDOCRINE: No polyuria, nocturia,  HEMATOLOGY: No anemia, easy bruising or bleeding SKIN: No rash or lesion. MUSCULOSKELETAL: No joint pain or arthritis.   NEUROLOGIC: No tingling, numbness, weakness.  PSYCHIATRY: No anxiety or depression.   MEDICATIONS AT HOME:  Prior to Admission medications   Medication Sig Start Date End Date Taking? Authorizing Provider  acetaminophen (TYLENOL) 325 MG tablet Take 650 mg by mouth every 6 (six) hours as needed.   Yes Historical Provider, MD  aspirin 81 MG tablet Take 81 mg by mouth daily.   Yes Historical Provider, MD  lisinopril-hydrochlorothiazide (PRINZIDE,ZESTORETIC) 20-12.5 MG tablet Take 1 tablet by mouth daily.   Yes Historical Provider, MD  Multiple Vitamin (MULTIVITAMIN) capsule Take 1 capsule by mouth daily.   Yes Historical Provider, MD  TURMERIC PO Take 1 capsule by mouth daily.   Yes Historical Provider, MD      PHYSICAL EXAMINATION:   VITAL SIGNS: Blood pressure 120/68, pulse (!) 102, resp. rate 16, SpO2 98 %.  GENERAL:  50 y.o.-year-old patient lying in the bed with no acute distress.  EYES: Pupils equal, round, reactive to  light and accommodation. No scleral icterus. Extraocular muscles intact.  HEENT: Head atraumatic, normocephalic. Oropharynx and nasopharynx clear.  NECK:  Supple, no jugular venous distention. No thyroid enlargement, no tenderness.  LUNGS: Normal breath sounds bilaterally, no wheezing, rales,rhonchi or crepitation. No use of accessory muscles of respiration.  CARDIOVASCULAR: S1, S2 irregular. No murmurs, rubs, or gallops.  ABDOMEN: Soft, nontender, nondistended. Bowel  sounds present. No organomegaly or mass.  EXTREMITIES: No pedal edema, cyanosis, or clubbing.  NEUROLOGIC: Cranial nerves II through XII are intact. Muscle strength 5/5 in all extremities. Sensation intact. Gait not checked.  PSYCHIATRIC: The patient is alert and oriented x 3.  SKIN: No obvious rash, lesion, or ulcer.   LABORATORY PANEL:   CBC  Recent Labs Lab 09/23/16 0106  WBC 11.3*  HGB 13.4  HCT 39.0*  PLT 256  MCV 81.2  MCH 28.0  MCHC 34.5  RDW 14.2   ------------------------------------------------------------------------------------------------------------------  Chemistries   Recent Labs Lab 09/23/16 0106  NA 139  K 3.2*  CL 106  CO2 25  GLUCOSE 137*  BUN 17  CREATININE 0.79  CALCIUM 9.0   ------------------------------------------------------------------------------------------------------------------ CrCl cannot be calculated (Unknown ideal weight.). ------------------------------------------------------------------------------------------------------------------ No results for input(s): TSH, T4TOTAL, T3FREE, THYROIDAB in the last 72 hours.  Invalid input(s): FREET3   Coagulation profile No results for input(s): INR, PROTIME in the last 168 hours. ------------------------------------------------------------------------------------------------------------------- No results for input(s): DDIMER in the last 72 hours. -------------------------------------------------------------------------------------------------------------------  Cardiac Enzymes  Recent Labs Lab 09/23/16 0106  TROPONINI <0.03   ------------------------------------------------------------------------------------------------------------------ Invalid input(s): POCBNP  ---------------------------------------------------------------------------------------------------------------  Urinalysis No results found for: COLORURINE, APPEARANCEUR, LABSPEC, PHURINE, GLUCOSEU, HGBUR,  BILIRUBINUR, KETONESUR, PROTEINUR, UROBILINOGEN, NITRITE, LEUKOCYTESUR   RADIOLOGY: Dg Chest 2 View  Result Date: 09/23/2016 CLINICAL DATA:  Acute onset of generalized chest pressure. Initial encounter. EXAM: CHEST  2 VIEW COMPARISON:  Chest radiograph performed 06/02/2011 FINDINGS: The lungs are well-aerated. Mild vascular congestion is noted. There is no evidence of focal opacification, pleural effusion or pneumothorax. The heart is normal in size; the mediastinal contour is within normal limits. No acute osseous abnormalities are seen. IMPRESSION: Mild vascular congestion noted.  Lungs remain grossly clear. Electronically Signed   By: Roanna Raider M.D.   On: 09/23/2016 01:24    EKG: Orders placed or performed during the hospital encounter of 09/23/16  . ED EKG within 10 minutes  . ED EKG within 10 minutes  . EKG 12-Lead  . EKG 12-Lead    IMPRESSION AND PLAN: 50 year old male patient with history of hypertension, arthritis, spinal stenosis presented to the emergency room with chest tightness and palpitations. Admitting diagnosis 1. Atrial fibrillation with rapid rate 2. Atypical chest discomfort 3. Hypokalemia 4. Hypertension Treatment plan Admit patient to telemetry observation bed Aspirin 81 mg daily DVT prophylaxis with sequential compression device to lower extremities Cycle troponin to rule out ischemia Check echocardiogram to assess LV function Replace potassium Cardiology consultation Check thyroid profile. Patient has rapid heart rate, will start on iv cardizem drip and anticoagulate patient with full dose lovenox until ischemia is ruled out. Supportive care.  All the records are reviewed and case discussed with ED provider. Management plans discussed with the patient, family and they are in agreement.  CODE STATUS:FULL    Code Status Orders        Start     Ordered   09/23/16 0250  Full code  Continuous     09/23/16 0250    Code Status History    Date  Active Date Inactive  Code Status Order ID Comments User Context   This patient has a current code status but no historical code status.       TOTAL TIME TAKING CARE OF THIS PATIENT: 50 minutes.    Ihor Austin M.D on 09/23/2016 at 2:51 AM  Between 7am to 6pm - Pager - [phone]  After 6pm go to [email or web address] - password [email or web address] [facility]  [place] [facility]  Office  [phone]  CC: Primary care physician; Morrie Sheldon, NP

## 2016-09-23 NOTE — Progress Notes (Signed)
ANTICOAGULATION CONSULT NOTE - Initial Consult  Pharmacy Consult for Lovenox dosing Indication: atrial fibrillation  No Known Allergies  Patient Measurements: TBW 138 kg per RN verbal report.   Enoxaparin Dosing Weight: 138 kg   Vital Signs: Temp: 98.1 F (36.7 C) (11/18 0548) Temp Source: Oral (11/18 0548) BP: 116/81 (11/18 0400) Pulse Rate: 91 (11/18 0400)  Labs:  Recent Labs  09/23/16 0106  HGB 13.4  HCT 39.0*  PLT 256  CREATININE 0.79  TROPONINI <0.03    CrCl cannot be calculated (Unknown ideal weight.).   Medical History: Past Medical History:  Diagnosis Date  . Arthritis   . Essential hypertension   . Kidney stone   . Spinal stenosis     Medications:    Assessment:  Goal of Therapy:   Monitor platelets by anticoagulation protocol: Yes   Plan:  Lovenox 140 mg q 12 hours ordered.   Bradley Powers S 09/23/2016,6:29 AM

## 2016-09-23 NOTE — ED Triage Notes (Signed)
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 Pt reports he just did not feel right.

## 2016-09-23 NOTE — Plan of Care (Signed)
BP 94/68   Pulse 77   Temp (!) 96.9 F (36.1 C) (Oral)   Resp 20   SpO2 96%   POC reviewed with patient, cont on self repositioning, vital signs closely monitored and any concerns addressed at this time. Will continue to monitor.   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

## 2016-09-23 NOTE — Progress Notes (Signed)
Report called to 2A RN, pt care and treatment reviewed. All questions answered, wife at bedside. Pt transported to 241 via w/c. Care to be assumed.   Vitals:   09/23/16 1930 09/23/16 2000 09/23/16 2100 09/23/16 2130  BP: 124/77 127/75 122/69 122/69  Pulse: 80 75 76 80  Resp:   (!) 21   Temp:  97.7 F (36.5 C)    TempSrc:  Oral    SpO2: 99% 98% 98%   Weight:      Height:

## 2016-09-24 LAB — BASIC METABOLIC PANEL
ANION GAP: 7 (ref 5–15)
BUN: 17 mg/dL (ref 6–20)
CO2: 25 mmol/L (ref 22–32)
Calcium: 8.6 mg/dL — ABNORMAL LOW (ref 8.9–10.3)
Chloride: 108 mmol/L (ref 101–111)
Creatinine, Ser: 0.79 mg/dL (ref 0.61–1.24)
Glucose, Bld: 108 mg/dL — ABNORMAL HIGH (ref 65–99)
POTASSIUM: 3.8 mmol/L (ref 3.5–5.1)
SODIUM: 140 mmol/L (ref 135–145)

## 2016-09-24 LAB — THYROID PANEL WITH TSH
FREE THYROXINE INDEX: 1.4 (ref 1.2–4.9)
T3 UPTAKE RATIO: 22 % — AB (ref 24–39)
T4 TOTAL: 6.2 ug/dL (ref 4.5–12.0)
TSH: 3.48 u[IU]/mL (ref 0.450–4.500)

## 2016-09-24 LAB — TSH: TSH: 2.149 u[IU]/mL (ref 0.350–4.500)

## 2016-09-24 LAB — ECHOCARDIOGRAM COMPLETE

## 2016-09-24 MED ORDER — AMOXICILLIN-POT CLAVULANATE 875-125 MG PO TABS: 1.0000 | ORAL_TABLET | Freq: Two times a day (BID) | ORAL | 0 refills | Status: DC

## 2016-09-24 MED ORDER — AMOXICILLIN-POT CLAVULANATE 875-125 MG PO TABS
1.0000 | ORAL_TABLET | Freq: Two times a day (BID) | ORAL | Status: DC
  Administered 2016-09-24: 1 via ORAL
  Filled 2016-09-24: qty 1

## 2016-09-24 MED ORDER — DILTIAZEM HCL ER COATED BEADS 240 MG PO CP24: 240.0000 mg | ORAL_CAPSULE | Freq: Every day | ORAL | 0 refills | Status: DC

## 2016-09-24 MED ORDER — APIXABAN 5 MG PO TABS: 5.0000 mg | ORAL_TABLET | Freq: Two times a day (BID) | ORAL | 0 refills | Status: DC

## 2016-09-24 MED ORDER — APIXABAN 5 MG PO TABS
5.0000 mg | ORAL_TABLET | Freq: Two times a day (BID) | ORAL | Status: DC
  Administered 2016-09-24: 5 mg via ORAL
  Filled 2016-09-24: qty 1

## 2016-09-24 MED ORDER — DILTIAZEM HCL ER COATED BEADS 240 MG PO CP24
240.0000 mg | ORAL_CAPSULE | Freq: Every day | ORAL | Status: DC
  Administered 2016-09-24: 240 mg via ORAL
  Filled 2016-09-24: qty 1

## 2016-09-24 MED ORDER — RIVAROXABAN 20 MG PO TABS
20.0000 mg | ORAL_TABLET | Freq: Every day | ORAL | Status: DC
Start: 2016-09-24 — End: 2016-09-24

## 2016-09-24 NOTE — Progress Notes (Signed)
ANTICOAGULATION CONSULT NOTE - Initial Consult  Pharmacy Consult for Apixaban Indication: atrial fibrillation  No Known Allergies  Patient Measurements: Height: 5\' 8"  (172.7 cm) Weight: (!) 304 lb 14.4 oz (138.3 kg) IBW/kg (Calculated) : 68.4 Heparin Dosing Weight:    Vital Signs: Temp: 97.8 F (36.6 C) (11/19 0519) Temp Source: Oral (11/19 0519) BP: 109/61 (11/19 0519) Pulse Rate: 71 (11/19 0519)  Labs:  Recent Labs  09/23/16 0106 09/23/16 0602 09/23/16 1211 09/23/16 1738 09/24/16 0551  HGB 13.4  --   --   --   --   HCT 39.0*  --   --   --   --   PLT 256  --   --   --   --   CREATININE 0.79 0.71  --   --  0.79  TROPONINI <0.03 <0.03 <0.03 <0.03  --     Estimated Creatinine Clearance: 152.3 mL/min (by C-G formula based on SCr of 0.79 mg/dL).   Medical History: Past Medical History:  Diagnosis Date  . Arthritis   . Essential hypertension   . Kidney stone   . Spinal stenosis     Medications:  Scheduled:  . amoxicillin-clavulanate  1 tablet Oral Q12H  . apixaban  5 mg Oral BID  . aspirin  81 mg Oral Daily  . diltiazem  240 mg Oral Daily  . Influenza vac split quadrivalent PF  0.5 mL Intramuscular Tomorrow-1000  . multivitamin with minerals  1 tablet Oral Daily  . mupirocin ointment   Nasal BID  . sodium chloride flush  3 mL Intravenous Q12H  . sodium chloride flush  3 mL Intravenous Q12H    Assessment: 50 yo M with new onset Atrial fibrillation on treatment Lovenox dosing.  Goal of Therapy:  Monitor platelets by anticoagulation protocol: Yes   Plan:  Will begin Apixaban 5 mg bid for Afib. Will start when next Lovenox dose due. Will d/c Lovenox.   Kanin Lia A 09/24/2016,7:55 AM

## 2016-09-24 NOTE — Consult Note (Signed)
[facility] Cardiology Consultation Note  Patient ID: Bradley Powers, MRN: [id number], DOB/AGE: 49-Feb-1967 49 y.o. Admit date: 09/23/2016   Date of Consult: 09/24/2016 Primary Physician: Morrie Sheldon, NP Primary Cardiologist: None  Chief Complaint:  Chief Complaint  Patient presents with  . Chest Pain   Reason for Consult: atrial fibrillation with rapid ventricular rate  HPI: 50 y.o. male with essential hypertension mixed hyperlipidemia having acute onset of shortness of breath palpitations irregular heart beat and dizziness. He was doing his work when this occurred. He did not have any syncope chest pain diaphoresis at the time. When seen in the emergency room the patient had atrial fibrillation with rapid ventricular rate. This was treated with diltiazem drip as well as other medication management. Over a 24-hour period with manipulation of medication management and further treatment of hypertension the patient had spontaneous conversion to normal sinus rhythm with no further evidence of symptoms. EKG shows normal sinus rhythm at this time with normal troponin and no current evidence of myocardial infarction. After further investigation the patient does likely have sleep apnea as well as hypertension causing this issue. Have discussed at length treatment and risk reduction  Past Medical History:  Diagnosis Date  . Arthritis   . Essential hypertension   . Kidney stone   . Spinal stenosis       Surgical History:  Past Surgical History:  Procedure Laterality Date  . BACK SURGERY  2000  . KNEE SURGERY Right      Home Meds: Prior to Admission medications   Medication Sig Start Date End Date Taking? Authorizing Provider  acetaminophen (TYLENOL) 325 MG tablet Take 650 mg by mouth every 6 (six) hours as needed.   Yes Historical Provider, MD  aspirin 81 MG tablet Take 81 mg by mouth daily.   Yes Historical Provider, MD  lisinopril-hydrochlorothiazide  (PRINZIDE,ZESTORETIC) 20-12.5 MG tablet Take 1 tablet by mouth daily.   Yes Historical Provider, MD  Multiple Vitamin (MULTIVITAMIN) capsule Take 1 capsule by mouth daily.   Yes Historical Provider, MD  TURMERIC PO Take 1 capsule by mouth daily.   Yes Historical Provider, MD    Inpatient Medications:  . aspirin  81 mg Oral Daily  . diltiazem  180 mg Oral Daily  . enoxaparin (LOVENOX) injection  140 mg Subcutaneous BID  . Influenza vac split quadrivalent PF  0.5 mL Intramuscular Tomorrow-1000  . metoprolol tartrate  50 mg Oral BID  . multivitamin with minerals  1 tablet Oral Daily  . mupirocin ointment   Nasal BID  . sodium chloride flush  3 mL Intravenous Q12H  . sodium chloride flush  3 mL Intravenous Q12H     Allergies: No Known Allergies  Social History   Social History  . Marital status: Married    Spouse name: N/A  . Number of children: N/A  . Years of education: N/A   Occupational History  . Human resources officer    Social History Main Topics  . Smoking status: Never Smoker  . Smokeless tobacco: Never Used  . Alcohol use 0.0 oz/week     Comment: occ  . Drug use: No  . Sexual activity: Not on file   Other Topics Concern  . Not on file   Social History Narrative   Married.   4 children.    Works in Airline pilot.   Enjoys DJ's, playing in a band.      Family History  Problem Relation Age of Onset  . Lung cancer Mother   .  Heart attack Father      Review of Systems Positive for Shortness of breath palpitations Negative for: General:  chills, fever, night sweats or weight changes.  Cardiovascular: PND orthopnea syncope dizziness  Dermatological skin lesions rashes Respiratory: Cough congestion Urologic: Frequent urination urination at night and hematuria Abdominal: negative for nausea, vomiting, diarrhea, bright red blood per rectum, melena, or hematemesis Neurologic: negative for visual changes, and/or hearing changes  All other systems reviewed and are  otherwise negative except as noted above.  Labs:  Recent Labs  09/23/16 0106 09/23/16 0602 09/23/16 1211 09/23/16 1738  TROPONINI <0.03 <0.03 <0.03 <0.03   Lab Results  Component Value Date   WBC 11.3 (H) 09/23/2016   HGB 13.4 09/23/2016   HCT 39.0 (L) 09/23/2016   MCV 81.2 09/23/2016   PLT 256 09/23/2016    Recent Labs Lab 09/24/16 0551  NA 140  K 3.8  CL 108  CO2 25  BUN 17  CREATININE 0.79  CALCIUM 8.6*  GLUCOSE 108*   No results found for: CHOL, HDL, LDLCALC, TRIG No results found for: DDIMER  Radiology/Studies:  Dg Chest 2 View  Result Date: 09/23/2016 CLINICAL DATA:  Acute onset of generalized chest pressure. Initial encounter. EXAM: CHEST  2 VIEW COMPARISON:  Chest radiograph performed 06/02/2011 FINDINGS: The lungs are well-aerated. Mild vascular congestion is noted. There is no evidence of focal opacification, pleural effusion or pneumothorax. The heart is normal in size; the mediastinal contour is within normal limits. No acute osseous abnormalities are seen. IMPRESSION: Mild vascular congestion noted.  Lungs remain grossly clear. Electronically Signed   By: Roanna Raider M.D.   On: 09/23/2016 01:24    EKG: Atrial fibrillation with rapid ventricular rate and nonspecific ST changes  Weights: Filed Weights   09/23/16 1530 09/23/16 2203  Weight: (!) 138.2 kg (304 lb 10.8 oz) (!) 138.3 kg (304 lb 14.4 oz)     Physical Exam: Blood pressure 109/61, pulse 71, temperature 97.8 F (36.6 C), temperature source Oral, resp. rate 18, height 5\' 8"  (1.727 m), weight (!) 138.3 kg (304 lb 14.4 oz), SpO2 100 %. Body mass index is 46.36 kg/m. General: Well developed, well nourished, in no acute distress. Head eyes ears nose throat: Normocephalic, atraumatic, sclera non-icteric, no xanthomas, nares are without discharge. No apparent thyromegaly and/or mass  Lungs: Normal respiratory effort.  Few wheezes, no rales, no rhonchi.  Heart: RRR with normal S1 S2. no murmur  gallop, no rub, PMI is normal size and placement, carotid upstroke normal without bruit, jugular venous pressure is normal Abdomen: Soft, non-tender, non-distended with normoactive bowel sounds. No hepatomegaly. No rebound/guarding. No obvious abdominal masses. Abdominal aorta is normal size without bruit Extremities: No edema. no cyanosis, no clubbing, no ulcers  Peripheral : 2+ bilateral upper extremity pulses, 2+ bilateral femoral pulses, 2+ bilateral dorsal pedal pulse Neuro: Alert and oriented. No facial asymmetry. No focal deficit. Moves all extremities spontaneously. Musculoskeletal: Normal muscle tone without kyphosis Psych:  Responds to questions appropriately with a normal affect.    Assessment: 50 year old male with essential hypertension makes hyperlipidemia with new onset atrial fibrillation nonvalvular in nature with rapid ventricular rate now spontaneously converted to normal sinus rhythm likely secondary to long-standing sleep apnea  Plan: 1. Continue medication management but manipulated to 1 medication including 240 mg of diltiazem long acting for heart rate control and maintenance of normal sinus rhythm 2. Anticoagulation for further risk reduction in stroke with atrial fibrillation until further treatment of primary causes 3.  Outpatient evaluation of sleep apnea and treatment options 4. Diltiazem as above for hypertension control 5. Okay for discharge home from cardiac standpoint with follow-up with the adjustments of medications as necessary in one to 2 weeks  Signed, Lamar Blinks M.D. [facility] 09/24/2016, 7:16 AM

## 2016-09-24 NOTE — Discharge Summary (Signed)
[facility] - [place] at [facility]   PATIENT NAME: Bradley Powers    MR#:  [id number]  DATE OF BIRTH:  Mar 06, 1966  DATE OF ADMISSION:  09/23/2016 ADMITTING PHYSICIAN: Ihor Austin, MD  DATE OF DISCHARGE: 09/24/2016 10:33 AM  PRIMARY CARE PHYSICIAN: Morrie Sheldon, NP    ADMISSION DIAGNOSIS:  Atrial fibrillation with rapid ventricular response (HCC) [I48.91] Chest pain, unspecified type [R07.9]  DISCHARGE DIAGNOSIS:  Principal Problem:   Chest pain Active Problems:   A-fib (HCC)   SECONDARY DIAGNOSIS:   Past Medical History:  Diagnosis Date  . Arthritis   . Essential hypertension   . Kidney stone   . Spinal stenosis     HOSPITAL COURSE:   1. Atrial fibrillation with rapid ventricular response. Patient was initially placed on Cardizem drip. Oral metoprolol and oral Cardizem started. Patient converted over to normal sinus rhythm. Cardiology saw the patient in consultation. Echocardiogram with normal EF. Patient converted to normal sinus rhythm. We did have a 30 day free Eliquis card that we gave to the patient. He will go home on by mouth Cardizem CD. Benefits and risks of major blood thinner explained. 2. Otitis media on the right. Augmentin prescribed for full course 3. Rheumatoid arthritis, psoriatic arthritis and osteoarthritis. Follow-up as outpatient  DISCHARGE CONDITIONS:   Satisfactory  CONSULTS OBTAINED:  Treatment Team:  Lamar Blinks, MD  DRUG ALLERGIES:  No Known Allergies  DISCHARGE MEDICATIONS:   Discharge Medication List as of 09/24/2016  9:43 AM    START taking these medications   Details  amoxicillin-clavulanate (AUGMENTIN) 875-125 MG tablet Take 1 tablet by mouth every 12 (twelve) hours., Starting Sun 09/24/2016, Print    apixaban (ELIQUIS) 5 MG TABS tablet Take 1 tablet (5 mg total) by mouth 2 (two) times daily., Starting Sun 09/24/2016, Print      CONTINUE these medications which have CHANGED   Details   diltiazem (CARDIZEM CD) 240 MG 24 hr capsule Take 1 capsule (240 mg total) by mouth daily., Starting Sun 09/24/2016, Print      CONTINUE these medications which have NOT CHANGED   Details  acetaminophen (TYLENOL) 325 MG tablet Take 650 mg by mouth every 6 (six) hours as needed., Historical Med    aspirin 81 MG tablet Take 81 mg by mouth daily., Historical Med    Multiple Vitamin (MULTIVITAMIN) capsule Take 1 capsule by mouth daily., Historical Med    TURMERIC PO Take 1 capsule by mouth daily., Historical Med      STOP taking these medications     lisinopril-hydrochlorothiazide (PRINZIDE,ZESTORETIC) 20-12.5 MG tablet          DISCHARGE INSTRUCTIONS:   Follow-up with Dr. Gwen Pounds one week Follow-up with PMD 2 weeks  If you experience worsening of your admission symptoms, develop shortness of breath, life threatening emergency, suicidal or homicidal thoughts you must seek medical attention immediately by calling 911 or calling your MD immediately  if symptoms less severe.  You Must read complete instructions/literature along with all the possible adverse reactions/side effects for all the Medicines you take and that have been prescribed to you. Take any new Medicines after you have completely understood and accept all the possible adverse reactions/side effects.   Please note  You were cared for by a hospitalist during your hospital stay. If you have any questions about your discharge medications or the care you received while you were in the hospital after you are discharged, you can call the unit and asked to speak  with the hospitalist on call if the hospitalist that took care of you is not available. Once you are discharged, your primary care physician will handle any further medical issues. Please note that NO REFILLS for any discharge medications will be authorized once you are discharged, as it is imperative that you return to your primary care physician (or establish a  relationship with a primary care physician if you do not have one) for your aftercare needs so that they can reassess your need for medications and monitor your lab values.    Today   CHIEF COMPLAINT:   Chief Complaint  Patient presents with  . Chest Pain    HISTORY OF PRESENT ILLNESS:  Bradley Powers  is a 50 y.o. male presented with chest pain and palpitations found to be in rapid atrial fibrillation   VITAL SIGNS:  Blood pressure 104/73, pulse 73, temperature 98 F (36.7 C), temperature source Oral, resp. rate 16, height 5\' 8"  (1.727 m), weight (!) 138.3 kg (304 lb 14.4 oz), SpO2 97 %.   PHYSICAL EXAMINATION:  GENERAL:  50 y.o.-year-old patient lying in the bed with no acute distress.  EYES: Pupils equal, round, reactive to light and accommodation. No scleral icterus. Extraocular muscles intact.  HEENT: Head atraumatic, normocephalic. Oropharynx and nasopharynx clear. Right tympanic membrane cloudy and erythematous left tympanic membrane cloudy. NECK:  Supple, no jugular venous distention. No thyroid enlargement, no tenderness.  LUNGS: Normal breath sounds bilaterally, no wheezing, rales,rhonchi or crepitation. No use of accessory muscles of respiration.  CARDIOVASCULAR: S1, S2 normal. No murmurs, rubs, or gallops.  ABDOMEN: Soft, non-tender, non-distended. Bowel sounds present. No organomegaly or mass.  EXTREMITIES: No pedal edema, cyanosis, or clubbing.  NEUROLOGIC: Cranial nerves II through XII are intact. Muscle strength 5/5 in all extremities. Sensation intact. Gait not checked.  PSYCHIATRIC: The patient is alert and oriented x 3.  SKIN: No obvious rash, lesion, or ulcer.   DATA REVIEW:   CBC  Recent Labs Lab 09/23/16 0106  WBC 11.3*  HGB 13.4  HCT 39.0*  PLT 256    Chemistries   Recent Labs Lab 09/24/16 0551  NA 140  K 3.8  CL 108  CO2 25  GLUCOSE 108*  BUN 17  CREATININE 0.79  CALCIUM 8.6*    Cardiac Enzymes  Recent Labs Lab 09/23/16 1738   TROPONINI <0.03    Microbiology Results  Results for orders placed or performed during the hospital encounter of 09/23/16  MRSA PCR Screening     Status: Abnormal   Collection Time: 09/23/16  5:43 AM  Result Value Ref Range Status   MRSA by PCR POSITIVE (A) NEGATIVE Final    Comment:        The GeneXpert MRSA Assay (FDA approved for NASAL specimens only), is one component of a comprehensive MRSA colonization surveillance program. It is not intended to diagnose MRSA infection nor to guide or monitor treatment for MRSA infections. RESULT CALLED TO, READ BACK BY AND VERIFIED WITH: CAROLINE SOGOMO ON 09/23/16 AT 0721 BY [facility]     RADIOLOGY:  Dg Chest 2 View  Result Date: 09/23/2016 CLINICAL DATA:  Acute onset of generalized chest pressure. Initial encounter. EXAM: CHEST  2 VIEW COMPARISON:  Chest radiograph performed 06/02/2011 FINDINGS: The lungs are well-aerated. Mild vascular congestion is noted. There is no evidence of focal opacification, pleural effusion or pneumothorax. The heart is normal in size; the mediastinal contour is within normal limits. No acute osseous abnormalities are seen. IMPRESSION: Mild vascular congestion  noted.  Lungs remain grossly clear. Electronically Signed   By: Roanna RaiderJeffery  Chang M.D.   On: 09/23/2016 01:24    Management plans discussed with the patient, family and they are in agreement.  CODE STATUS:  Code Status History    Date Active Date Inactive Code Status Order ID Comments User Context   09/23/2016  2:50 AM 09/24/2016  1:38 PM Full Code [id number]  Ihor AustinPavan Pyreddy, MD ED      TOTAL TIME TAKING CARE OF THIS PATIENT: 35 minutes.    Alford HighlandWIETING, Sasha Rueth M.D on 09/24/2016 at 3:32 PM  Between 7am to 6pm - Pager - [phone]  After 6pm go to [email or web address] - password [email or web address]  [facility] Office  [phone]  CC: Primary care physician; Morrie Sheldonlark,Katherine Kendal, NP

## 2016-09-24 NOTE — Progress Notes (Signed)
Pt to be discharged today. Ambulated at length in hall. tol well. Tele remains in nsr. disch via w.c. Accompanied by s.o.

## 2016-09-26 ENCOUNTER — Telehealth: Payer: Self-pay

## 2016-09-26 NOTE — Telephone Encounter (Signed)
Patient reports H/A since discharge. Also has questions regarding taking ASA and Eliquis together. Please call to advise.

## 2016-09-26 NOTE — Telephone Encounter (Signed)
Transition Care Management Follow-up Telephone Call   Date discharged? 09/24/16   How have you been since you were released from the hospital? "i've had a headache ever since I got out of the hospital.    Do you understand why you were in the hospital? yes   Do you understand the discharge instructions? yes, but has questions regarding ASA and Eliquis together.    Where were you discharged to? Home.    Items Reviewed:  Medications reviewed: yes  Allergies reviewed: yes  Dietary changes reviewed: yes  Referrals reviewed: yes   Functional Questionnaire:   Activities of Daily Living (ADLs):   He states they are independent in the following: ambulation, bathing and hygiene, feeding, continence, grooming, toileting and dressing States they require assistance with the following: none   Any transportation issues/concerns?: no   Any patient concerns? yes, reports H/A since discharge not relieved by Tylenol. Has questions regarding ASA and Eliquis together.     Confirmed importance and date/time of follow-up visits scheduled yes  Provider Appointment booked with PCP Tues 11/28 at 3:15.   Confirmed with patient if condition begins to worsen call PCP or go to the ER.  Patient was given the office number and encouraged to call back with question or concerns.  : yes

## 2016-09-27 NOTE — Telephone Encounter (Signed)
I would defer this question to his cardiologist. I think the Eliquis alone would be sufficient, but please have him consult with his cardiologist to ensure this is okay.

## 2016-09-27 NOTE — Telephone Encounter (Signed)
Spoken and notified patient of Kate's comments. Patient verbalized understanding. 

## 2016-10-03 ENCOUNTER — Ambulatory Visit: Payer: Self-pay | Admitting: Primary Care

## 2016-10-03 DIAGNOSIS — Z0289 Encounter for other administrative examinations: Secondary | ICD-10-CM

## 2016-10-04 ENCOUNTER — Telehealth: Payer: Self-pay | Admitting: Primary Care

## 2016-10-04 NOTE — Telephone Encounter (Signed)
Patient did not come in for their appointment on 10/03/16 for hosp follow up Please let me know if patient needs to be contacted immediately for follow up or no follow up needed.

## 2016-10-04 NOTE — Telephone Encounter (Signed)
Please reschedule at his convenience if possible.

## 2016-10-09 ENCOUNTER — Encounter: Payer: Self-pay | Admitting: Primary Care

## 2016-10-11 NOTE — Telephone Encounter (Signed)
L/m to r/s appt  °

## 2016-10-18 DIAGNOSIS — G4733 Obstructive sleep apnea (adult) (pediatric): Secondary | ICD-10-CM | POA: Insufficient documentation

## 2016-10-26 ENCOUNTER — Encounter: Payer: Self-pay | Admitting: Primary Care

## 2016-10-26 NOTE — Telephone Encounter (Signed)
Sent letter to reschedule appt.

## 2017-08-22 ENCOUNTER — Telehealth: Payer: Self-pay | Admitting: *Deleted

## 2017-08-22 NOTE — Telephone Encounter (Signed)
Patient's wife (on [place])  left a voicemail stating that she has tested positive for Hep C and wants to know if her husband can come in and be checked for this?  Please call back and let her know.

## 2017-08-22 NOTE — Telephone Encounter (Signed)
Yes, we can do further testing. Please schedule. When did he test positive? Where?

## 2017-08-23 NOTE — Telephone Encounter (Signed)
Spoken to patient and schedule appt on 08/29/2017  Patient's wife was tested positive last week and in this office.

## 2017-08-23 NOTE — Telephone Encounter (Signed)
Noted  

## 2017-08-29 ENCOUNTER — Ambulatory Visit (INDEPENDENT_AMBULATORY_CARE_PROVIDER_SITE_OTHER): Payer: BC Managed Care – PPO | Admitting: Primary Care

## 2017-08-29 ENCOUNTER — Encounter: Payer: Self-pay | Admitting: Primary Care

## 2017-08-29 VITALS — BP 126/74 | HR 72 | Temp 98.1°F | Ht 67.5 in | Wt 314.8 lb

## 2017-08-29 DIAGNOSIS — R739 Hyperglycemia, unspecified: Secondary | ICD-10-CM

## 2017-08-29 DIAGNOSIS — Z205 Contact with and (suspected) exposure to viral hepatitis: Secondary | ICD-10-CM

## 2017-08-29 NOTE — Progress Notes (Signed)
   Subjective:    Patient ID: Bradley Powers, male    DOB: 07-27-1966, 50 y.o.   MRN: [id number]  HPI  Bradley Powers is a 51 year old male who presents today for Hepatitis C screening.  His wife tested positive for Hepatitis C three weeks ago. She is scheduled for treatment next week. She receives blood transfusions regularly due to side effects of anemia from gastric bypass. She also has several tattoos. He denies abdominal pain, unexplained weight loss, nausea, vomiting, excessive alcohol use, history of abnormal liver enzymes.    Review of Systems  Constitutional: Negative for unexpected weight change.  Gastrointestinal: Negative for abdominal pain, nausea and vomiting.  Skin: Negative for color change.  Neurological: Negative for weakness.       Past Medical History:  Diagnosis Date  . Arthritis   . Essential hypertension   . Kidney stone   . Spinal stenosis      Social History   Social History  . Marital status: Married    Spouse name: N/A  . Number of children: N/A  . Years of education: N/A   Occupational History  . Human resources officer    Social History Main Topics  . Smoking status: Never Smoker  . Smokeless tobacco: Never Used  . Alcohol use 0.0 oz/week     Comment: occ  . Drug use: No  . Sexual activity: Not on file   Other Topics Concern  . Not on file   Social History Narrative   Married.   4 children.    Works in Airline pilot.   Enjoys DJ's, playing in a band.     Past Surgical History:  Procedure Laterality Date  . BACK SURGERY  2000  . KNEE SURGERY Right     Family History  Problem Relation Age of Onset  . Lung cancer Mother   . Heart attack Father     No Known Allergies  Current Outpatient Prescriptions on File Prior to Visit  Medication Sig Dispense Refill  . acetaminophen (TYLENOL) 325 MG tablet Take 650 mg by mouth every 6 (six) hours as needed.    Marland Kitchen aspirin 81 MG tablet Take 81 mg by mouth daily.    Marland Kitchen diltiazem (CARDIZEM CD) 240  MG 24 hr capsule Take 1 capsule (240 mg total) by mouth daily. 30 capsule 0  . Multiple Vitamin (MULTIVITAMIN) capsule Take 1 capsule by mouth daily.    . TURMERIC PO Take 1 capsule by mouth daily.     No current facility-administered medications on file prior to visit.     BP 126/74   Pulse 72   Temp 98.1 F (36.7 C) (Oral)   Ht 5' 7.5" (1.715 m)   Wt (!) 314 lb 12.8 oz (142.8 kg)   SpO2 96%   BMI 48.58 kg/m    Objective:   Physical Exam  Constitutional: He appears well-nourished.  Eyes: Conjunctivae are normal.  Sclera white  Cardiovascular: Normal rate and regular rhythm.   Pulmonary/Chest: Effort normal and breath sounds normal.  Abdominal: Soft. Bowel sounds are normal. There is no tenderness.  Skin: Skin is warm and dry.  Skin appropriate color          Assessment & Plan:  Exposure to Hep C:  Wife diagnosed with Hep C 3 weeks ago, pending treatment. He is asymptomatic. Exam unremarkable. No alarm signs. Check Hep C, LFT's, A1C. Will await labs.  Morrie Sheldon, NP

## 2017-08-29 NOTE — Patient Instructions (Addendum)
Complete lab work prior to leaving today. I will notify you of your results once received.   It was a pleasure to see you today!  

## 2017-08-30 LAB — COMPREHENSIVE METABOLIC PANEL
ALT: 24 U/L (ref 0–53)
AST: 21 U/L (ref 0–37)
Albumin: 4.2 g/dL (ref 3.5–5.2)
Alkaline Phosphatase: 79 U/L (ref 39–117)
BILIRUBIN TOTAL: 0.3 mg/dL (ref 0.2–1.2)
BUN: 17 mg/dL (ref 6–23)
CALCIUM: 9.6 mg/dL (ref 8.4–10.5)
CHLORIDE: 103 meq/L (ref 96–112)
CO2: 29 meq/L (ref 19–32)
Creatinine, Ser: 0.78 mg/dL (ref 0.40–1.50)
GFR: 111.94 mL/min (ref >60.00)
Glucose, Bld: 159 mg/dL — ABNORMAL HIGH (ref 70–99)
POTASSIUM: 3.7 meq/L (ref 3.5–5.1)
Sodium: 142 mEq/L (ref 135–145)
Total Protein: 7.4 g/dL (ref 6.0–8.3)

## 2017-08-30 LAB — HEMOGLOBIN A1C: Hgb A1c MFr Bld: 5.7 % (ref 4.6–6.5)

## 2017-08-30 LAB — CBC
HEMATOCRIT: 39.4 % (ref 39.0–52.0)
HEMOGLOBIN: 13.3 g/dL (ref 13.0–17.0)
MCHC: 33.8 g/dL (ref 30.0–36.0)
MCV: 85.6 fl (ref 78.0–100.0)
Platelets: 339 10*3/uL (ref 150.0–400.0)
RBC: 4.61 Mil/uL (ref 4.22–5.81)
RDW: 14.4 % (ref 11.5–15.5)
WBC: 9.1 10*3/uL (ref 4.0–10.5)

## 2017-08-30 LAB — HEPATITIS C ANTIBODY
Hepatitis C Ab: NONREACTIVE
SIGNAL TO CUT-OFF: 0.02 (ref <1.00)

## 2020-03-10 NOTE — Telephone Encounter (Signed)
Last OV was 08/29/2017

## 2021-06-25 ENCOUNTER — Other Ambulatory Visit: Payer: Self-pay | Admitting: Internal Medicine

## 2021-06-25 MED ORDER — AMOXICILLIN 875 MG PO TABS: 875.0000 mg | ORAL_TABLET | Freq: Two times a day (BID) | ORAL | 0 refills | Status: DC

## 2023-02-15 ENCOUNTER — Encounter: Payer: Self-pay | Admitting: Internal Medicine

## 2023-02-15 ENCOUNTER — Ambulatory Visit: Payer: 59 | Admitting: Internal Medicine

## 2023-02-15 VITALS — BP 120/62 | HR 72 | Ht 69.0 in | Wt 287.4 lb

## 2023-02-15 DIAGNOSIS — Z87898 Personal history of other specified conditions: Secondary | ICD-10-CM | POA: Insufficient documentation

## 2023-02-15 DIAGNOSIS — L4050 Arthropathic psoriasis, unspecified: Secondary | ICD-10-CM

## 2023-02-15 DIAGNOSIS — Z6841 Body Mass Index (BMI) 40.0 and over, adult: Secondary | ICD-10-CM | POA: Diagnosis not present

## 2023-02-15 DIAGNOSIS — I011 Acute rheumatic endocarditis: Secondary | ICD-10-CM | POA: Insufficient documentation

## 2023-02-15 DIAGNOSIS — E66812 Obesity, class 2: Secondary | ICD-10-CM | POA: Insufficient documentation

## 2023-02-15 DIAGNOSIS — M159 Polyosteoarthritis, unspecified: Secondary | ICD-10-CM

## 2023-02-15 DIAGNOSIS — I10 Essential (primary) hypertension: Secondary | ICD-10-CM

## 2023-02-15 DIAGNOSIS — R7303 Prediabetes: Secondary | ICD-10-CM | POA: Insufficient documentation

## 2023-02-15 DIAGNOSIS — M1990 Unspecified osteoarthritis, unspecified site: Secondary | ICD-10-CM | POA: Insufficient documentation

## 2023-02-15 DIAGNOSIS — M069 Rheumatoid arthritis, unspecified: Secondary | ICD-10-CM | POA: Insufficient documentation

## 2023-02-15 DIAGNOSIS — I480 Paroxysmal atrial fibrillation: Secondary | ICD-10-CM | POA: Diagnosis not present

## 2023-02-15 LAB — CBC
HCT: 40.2 % (ref 38.5–50.0)
MCH: 27.6 pg (ref 27.0–33.0)
MCHC: 33.3 g/dL (ref 32.0–36.0)
MCV: 82.9 fL (ref 80.0–100.0)
RDW: 13.4 % (ref 11.0–15.0)

## 2023-02-15 NOTE — Progress Notes (Signed)
HPI  Patient presents to clinic today to establish care and for management of the conditions listed below.  HTN: His BP today is 120/62.  He is taking Lisinopril and Diltiazem as prescribed.  ECG from 09/2016 reviewed.  Psoriatic/Rheumatic and Osteoarthritis: He takes Tylenol and Ibuprofen as needed with good relief of symptoms.  He does not follow with rheumatology.  A-fib: He is taking Diltiazem but is not taking Aspirin as prescribed.  ECG from 09/2016 reviewed.  Prediabetes: His last A1c was 5.7%, 08/2017.  He is not taking any oral diabetic medication at this time.  He does not check his sugars.  Past Medical History:  Diagnosis Date   Arthritis    Essential hypertension    Kidney stone    Spinal stenosis     Current Outpatient Medications  Medication Sig Dispense Refill   acetaminophen (TYLENOL) 325 MG tablet Take 650 mg by mouth every 6 (six) hours as needed.     amoxicillin (AMOXIL) 875 MG tablet Take 1 tablet (875 mg total) by mouth 2 (two) times daily. 20 tablet 0   aspirin 81 MG tablet Take 81 mg by mouth daily.     diltiazem (CARDIZEM CD) 240 MG 24 hr capsule Take 1 capsule (240 mg total) by mouth daily. 30 capsule 0   Multiple Vitamin (MULTIVITAMIN) capsule Take 1 capsule by mouth daily.     TURMERIC PO Take 1 capsule by mouth daily.     No current facility-administered medications for this visit.    No Known Allergies  Family History  Problem Relation Age of Onset   Lung cancer Mother    Heart attack Father     Social History   Socioeconomic History   Marital status: Married    Spouse name: Not on file   Number of children: Not on file   Years of education: Not on file   Highest education level: Not on file  Occupational History   Occupation: Human resources officersales representative  Tobacco Use   Smoking status: Never   Smokeless tobacco: Never  Substance and Sexual Activity   Alcohol use: Yes    Alcohol/week: 0.0 standard drinks of alcohol    Comment: occ   Drug  use: No   Sexual activity: Not on file  Other Topics Concern   Not on file  Social History Narrative   Married.   4 children.    Works in Airline pilotsales.   Enjoys DJ's, playing in a band.    Social Determinants of Health   Financial Resource Strain: Not on file  Food Insecurity: Not on file  Transportation Needs: Not on file  Physical Activity: Not on file  Stress: Not on file  Social Connections: Not on file  Intimate Partner Violence: Not on file    ROS:  Constitutional: Denies fever, malaise, fatigue, headache or abrupt weight changes.  HEENT: Denies eye pain, eye redness, ear pain, ringing in the ears, wax buildup, runny nose, nasal congestion, bloody nose, or sore throat. Respiratory: Denies difficulty breathing, shortness of breath, cough or sputum production.   Cardiovascular: Denies chest pain, chest tightness, palpitations or swelling in the hands or feet.  Gastrointestinal: Denies abdominal pain, bloating, constipation, diarrhea or blood in the stool.  GU: Denies frequency, urgency, pain with urination, blood in urine, odor or discharge. Musculoskeletal: Patient reports intermittent joint pain.  Denies decrease in range of motion, difficulty with gait, muscle pain or joint swelling.  Skin: Denies redness, rashes, lesions or ulcercations.  Neurological: Denies dizziness, difficulty  with memory, difficulty with speech or problems with balance and coordination.  Psych: Denies anxiety, depression, SI/HI.  No other specific complaints in a complete review of systems (except as listed in HPI above).  PE:  BP 120/62 (BP Location: Left Arm, Patient Position: Sitting)   Pulse 72   Ht 5\' 9"  (1.753 m)   Wt 287 lb 6.4 oz (130.4 kg)   SpO2 95%   BMI 42.44 kg/m   Wt Readings from Last 3 Encounters:  08/29/17 (!) 314 lb 12.8 oz (142.8 kg)  09/23/16 (!) 304 lb 14.4 oz (138.3 kg)  04/11/16 278 lb 1.9 oz (126.2 kg)    General: Appears his stated age, obese, in NAD. HEENT: Head:  normal shape and size; Eyes: sclera white, no icterus, conjunctiva pink, PERRLA and EOMs intact;  Cardiovascular: Normal rate and rhythm. S1,S2 noted.  No murmur, rubs or gallops noted. No JVD or BLE edema. No carotid bruits noted. Pulmonary/Chest: Normal effort and positive vesicular breath sounds. No respiratory distress. No wheezes, rales or ronchi noted.  Musculoskeletal: Joint deformity noted in hands. No difficulty with gait.  Neurological: Alert and oriented. Cranial nerves II-XII grossly intact. Coordination normal.  Psychiatric: Mood and affect normal. Behavior is normal. Judgment and thought content normal.     BMET    Component Value Date/Time   NA 142 08/29/2017 1641   K 3.7 08/29/2017 1641   CL 103 08/29/2017 1641   CO2 29 08/29/2017 1641   GLUCOSE 159 (H) 08/29/2017 1641   BUN 17 08/29/2017 1641   CREATININE 0.78 08/29/2017 1641   CALCIUM 9.6 08/29/2017 1641   GFRNONAA >60 09/24/2016 0551   GFRAA >60 09/24/2016 0551    Lipid Panel  No results found for: "CHOL", "TRIG", "HDL", "CHOLHDL", "VLDL", "LDLCALC"  CBC    Component Value Date/Time   WBC 9.1 08/29/2017 1641   RBC 4.61 08/29/2017 1641   HGB 13.3 08/29/2017 1641   HCT 39.4 08/29/2017 1641   PLT 339.0 08/29/2017 1641   MCV 85.6 08/29/2017 1641   MCH 28.0 09/23/2016 0106   MCHC 33.8 08/29/2017 1641   RDW 14.4 08/29/2017 1641    Hgb A1C Lab Results  Component Value Date   HGBA1C 5.7 08/29/2017     Assessment and Plan:    RTC in 6 months for annual exam Nicki Reaper, NP

## 2023-02-15 NOTE — Assessment & Plan Note (Signed)
Encourage diet and exercise for weight loss 

## 2023-02-15 NOTE — Assessment & Plan Note (Signed)
Only taking Tylenol and ibuprofen OTC

## 2023-02-15 NOTE — Assessment & Plan Note (Signed)
Only taking ibuprofen and Tylenol OTC

## 2023-02-15 NOTE — Assessment & Plan Note (Signed)
Controlled on lisinopril and diltiazem Reinforced DASH diet and exercise for weight loss

## 2023-02-15 NOTE — Patient Instructions (Signed)
Calorie Counting for Weight Loss Calories are units of energy. Your body needs a certain number of calories from food to keep going throughout the day. When you eat or drink more calories than your body needs, your body stores the extra calories mostly as fat. When you eat or drink fewer calories than your body needs, your body burns fat to get the energy it needs. Calorie counting means keeping track of how many calories you eat and drink each day. Calorie counting can be helpful if you need to lose weight. If you eat fewer calories than your body needs, you should lose weight. Ask your health care provider what a healthy weight is for you. For calorie counting to work, you will need to eat the right number of calories each day to lose a healthy amount of weight per week. A dietitian can help you figure out how many calories you need in a day and will suggest ways to reach your calorie goal. A healthy amount of weight to lose each week is usually 1-2 lb (0.5-0.9 kg). This usually means that your daily calorie intake should be reduced by 500-750 calories. Eating 1,200-1,500 calories a day can help most women lose weight. Eating 1,500-1,800 calories a day can help most men lose weight. What do I need to know about calorie counting? Work with your health care provider or dietitian to determine how many calories you should get each day. To meet your daily calorie goal, you will need to: Find out how many calories are in each food that you would like to eat. Try to do this before you eat. Decide how much of the food you plan to eat. Keep a food log. Do this by writing down what you ate and how many calories it had. To successfully lose weight, it is important to balance calorie counting with a healthy lifestyle that includes regular activity. Where do I find calorie information?  The number of calories in a food can be found on a Nutrition Facts label. If a food does not have a Nutrition Facts label, try  to look up the calories online or ask your dietitian for help. Remember that calories are listed per serving. If you choose to have more than one serving of a food, you will have to multiply the calories per serving by the number of servings you plan to eat. For example, the label on a package of bread might say that a serving size is 1 slice and that there are 90 calories in a serving. If you eat 1 slice, you will have eaten 90 calories. If you eat 2 slices, you will have eaten 180 calories. How do I keep a food log? After each time that you eat, record the following in your food log as soon as possible: What you ate. Be sure to include toppings, sauces, and other extras on the food. How much you ate. This can be measured in cups, ounces, or number of items. How many calories were in each food and drink. The total number of calories in the food you ate. Keep your food log near you, such as in a pocket-sized notebook or on an app or website on your mobile phone. Some programs will calculate calories for you and show you how many calories you have left to meet your daily goal. What are some portion-control tips? Know how many calories are in a serving. This will help you know how many servings you can have of a certain   food. Use a measuring cup to measure serving sizes. You could also try weighing out portions on a kitchen scale. With time, you will be able to estimate serving sizes for some foods. Take time to put servings of different foods on your favorite plates or in your favorite bowls and cups so you know what a serving looks like. Try not to eat straight from a food's packaging, such as from a bag or box. Eating straight from the package makes it hard to see how much you are eating and can lead to overeating. Put the amount you would like to eat in a cup or on a plate to make sure you are eating the right portion. Use smaller plates, glasses, and bowls for smaller portions and to prevent  overeating. Try not to multitask. For example, avoid watching TV or using your computer while eating. If it is time to eat, sit down at a table and enjoy your food. This will help you recognize when you are full. It will also help you be more mindful of what and how much you are eating. What are tips for following this plan? Reading food labels Check the calorie count compared with the serving size. The serving size may be smaller than what you are used to eating. Check the source of the calories. Try to choose foods that are high in protein, fiber, and vitamins, and low in saturated fat, trans fat, and sodium. Shopping Read nutrition labels while you shop. This will help you make healthy decisions about which foods to buy. Pay attention to nutrition labels for low-fat or fat-free foods. These foods sometimes have the same number of calories or more calories than the full-fat versions. They also often have added sugar, starch, or salt to make up for flavor that was removed with the fat. Make a grocery list of lower-calorie foods and stick to it. Cooking Try to cook your favorite foods in a healthier way. For example, try baking instead of frying. Use low-fat dairy products. Meal planning Use more fruits and vegetables. One-half of your plate should be fruits and vegetables. Include lean proteins, such as chicken, turkey, and fish. Lifestyle Each week, aim to do one of the following: 150 minutes of moderate exercise, such as walking. 75 minutes of vigorous exercise, such as running. General information Know how many calories are in the foods you eat most often. This will help you calculate calorie counts faster. Find a way of tracking calories that works for you. Get creative. Try different apps or programs if writing down calories does not work for you. What foods should I eat?  Eat nutritious foods. It is better to have a nutritious, high-calorie food, such as an avocado, than a food with  few nutrients, such as a bag of potato chips. Use your calories on foods and drinks that will fill you up and will not leave you hungry soon after eating. Examples of foods that fill you up are nuts and nut butters, vegetables, lean proteins, and high-fiber foods such as whole grains. High-fiber foods are foods with more than 5 g of fiber per serving. Pay attention to calories in drinks. Low-calorie drinks include water and unsweetened drinks. The items listed above may not be a complete list of foods and beverages you can eat. Contact a dietitian for more information. What foods should I limit? Limit foods or drinks that are not good sources of vitamins, minerals, or protein or that are high in unhealthy fats. These   include: Candy. Other sweets. Sodas, specialty coffee drinks, alcohol, and juice. The items listed above may not be a complete list of foods and beverages you should avoid. Contact a dietitian for more information. How do I count calories when eating out? Pay attention to portions. Often, portions are much larger when eating out. Try these tips to keep portions smaller: Consider sharing a meal instead of getting your own. If you get your own meal, eat only half of it. Before you start eating, ask for a container and put half of your meal into it. When available, consider ordering smaller portions from the menu instead of full portions. Pay attention to your food and drink choices. Knowing the way food is cooked and what is included with the meal can help you eat fewer calories. If calories are listed on the menu, choose the lower-calorie options. Choose dishes that include vegetables, fruits, whole grains, low-fat dairy products, and lean proteins. Choose items that are boiled, broiled, grilled, or steamed. Avoid items that are buttered, battered, fried, or served with cream sauce. Items labeled as crispy are usually fried, unless stated otherwise. Choose water, low-fat milk,  unsweetened iced tea, or other drinks without added sugar. If you want an alcoholic beverage, choose a lower-calorie option, such as a glass of wine or light beer. Ask for dressings, sauces, and syrups on the side. These are usually high in calories, so you should limit the amount you eat. If you want a salad, choose a garden salad and ask for grilled meats. Avoid extra toppings such as bacon, cheese, or fried items. Ask for the dressing on the side, or ask for olive oil and vinegar or lemon to use as dressing. Estimate how many servings of a food you are given. Knowing serving sizes will help you be aware of how much food you are eating at restaurants. Where to find more information Centers for Disease Control and Prevention: www.cdc.gov [place] Department of Agriculture: myplate.gov Summary Calorie counting means keeping track of how many calories you eat and drink each day. If you eat fewer calories than your body needs, you should lose weight. A healthy amount of weight to lose per week is usually 1-2 lb (0.5-0.9 kg). This usually means reducing your daily calorie intake by 500-750 calories. The number of calories in a food can be found on a Nutrition Facts label. If a food does not have a Nutrition Facts label, try to look up the calories online or ask your dietitian for help. Use smaller plates, glasses, and bowls for smaller portions and to prevent overeating. Use your calories on foods and drinks that will fill you up and not leave you hungry shortly after a meal. This information is not intended to replace advice given to you by your health care provider. Make sure you discuss any questions you have with your health care provider. Document Revised: 12/04/2019 Document Reviewed: 12/04/2019 Elsevier Patient Education  2023 [employer]vier Inc.  

## 2023-02-15 NOTE — Assessment & Plan Note (Signed)
Only taking Tylenol and ibuprofen OTC 

## 2023-02-15 NOTE — Assessment & Plan Note (Signed)
Continue diltiazem Not taking aspirin per cardiology

## 2023-02-15 NOTE — Assessment & Plan Note (Signed)
A1c today Encourage low-carb diet 

## 2023-02-16 LAB — HEMOGLOBIN A1C
Hgb A1c MFr Bld: 5.3 % of total Hgb (ref <5.7)
Mean Plasma Glucose: 105 mg/dL
eAG (mmol/L): 5.8 mmol/L

## 2023-02-16 LAB — LIPID PANEL
Cholesterol: 142 mg/dL (ref <200)
HDL: 29 mg/dL — ABNORMAL LOW (ref >=40)
LDL Cholesterol (Calc): 84 mg/dL (calc)
Non-HDL Cholesterol (Calc): 113 mg/dL (calc) (ref <130)
Total CHOL/HDL Ratio: 4.9 (calc) (ref <5.0)
Triglycerides: 196 mg/dL — ABNORMAL HIGH (ref <150)

## 2023-02-16 LAB — COMPLETE METABOLIC PANEL WITH GFR
AG Ratio: 1.4 (calc) (ref 1.0–2.5)
ALT: 24 U/L (ref 9–46)
AST: 21 U/L (ref 10–35)
Albumin: 4.1 g/dL (ref 3.6–5.1)
Alkaline phosphatase (APISO): 77 U/L (ref 35–144)
BUN: 17 mg/dL (ref 7–25)
CO2: 28 mmol/L (ref 20–32)
Calcium: 9.3 mg/dL (ref 8.6–10.3)
Chloride: 104 mmol/L (ref 98–110)
Creat: 0.73 mg/dL (ref 0.70–1.30)
Globulin: 2.9 g/dL (calc) (ref 1.9–3.7)
Glucose, Bld: 107 mg/dL (ref 65–139)
Potassium: 3.8 mmol/L (ref 3.5–5.3)
Sodium: 141 mmol/L (ref 135–146)
Total Bilirubin: 0.4 mg/dL (ref 0.2–1.2)
Total Protein: 7 g/dL (ref 6.1–8.1)
eGFR: 107 mL/min/{1.73_m2} (ref >=60)

## 2023-02-16 LAB — CBC
Hemoglobin: 13.4 g/dL (ref 13.2–17.1)
MPV: 8.9 fL (ref 7.5–12.5)
Platelets: 271 10*3/uL (ref 140–400)
RBC: 4.85 10*6/uL (ref 4.20–5.80)
WBC: 6.8 10*3/uL (ref 3.8–10.8)

## 2023-02-21 ENCOUNTER — Other Ambulatory Visit: Payer: Self-pay | Admitting: Internal Medicine

## 2023-02-21 MED ORDER — TOPIRAMATE 25 MG PO TABS: 25.0000 mg | ORAL_TABLET | Freq: Two times a day (BID) | ORAL | 0 refills | Status: DC

## 2023-02-21 MED ORDER — PHENTERMINE HCL 15 MG PO CAPS: 15.0000 mg | ORAL_CAPSULE | ORAL | 0 refills | Status: DC

## 2023-03-15 DIAGNOSIS — I480 Paroxysmal atrial fibrillation: Secondary | ICD-10-CM | POA: Diagnosis not present

## 2023-03-15 DIAGNOSIS — Z6841 Body Mass Index (BMI) 40.0 and over, adult: Secondary | ICD-10-CM | POA: Diagnosis not present

## 2023-03-15 DIAGNOSIS — G4733 Obstructive sleep apnea (adult) (pediatric): Secondary | ICD-10-CM | POA: Diagnosis not present

## 2023-03-15 DIAGNOSIS — I10 Essential (primary) hypertension: Secondary | ICD-10-CM | POA: Diagnosis not present

## 2023-04-02 ENCOUNTER — Other Ambulatory Visit: Payer: Self-pay | Admitting: Internal Medicine

## 2023-04-03 MED ORDER — TOPIRAMATE 25 MG PO TABS: 25.0000 mg | ORAL_TABLET | Freq: Two times a day (BID) | ORAL | 0 refills | Status: DC

## 2023-04-03 MED ORDER — PHENTERMINE HCL 30 MG PO CAPS: 30.0000 mg | ORAL_CAPSULE | ORAL | 0 refills | Status: DC

## 2023-04-25 ENCOUNTER — Other Ambulatory Visit: Payer: Self-pay | Admitting: Internal Medicine

## 2023-04-25 NOTE — Telephone Encounter (Signed)
Requested medication (s) are due for refill today - yes  Requested medication (s) are on the active medication list - yes  Future visit scheduled -yes  Last refill: topiramate- 04/03/23 #90- please review sig                 Phentermine- 04/03/23 #30- non delegated Rx  Notes to clinic: Please review Rx sig for toprimate- directed for twice daily- #90 is only a 45 day Rx, non delegated Rx  Requested Prescriptions  Pending Prescriptions Disp Refills   topiramate (TOPAMAX) 25 MG tablet [Pharmacy Med Name: Topiramate 25 MG Oral Tablet] 90 tablet 0    Sig: Take 1 tablet by mouth twice daily     Neurology: Anticonvulsants - topiramate & zonisamide Passed - 04/25/2023  9:42 AM      Passed - Cr in normal range and within 360 days    Creat  Date Value Ref Range Status  02/15/2023 0.73 0.70 - 1.30 mg/dL Final         Passed - CO2 in normal range and within 360 days    CO2  Date Value Ref Range Status  02/15/2023 28 20 - 32 mmol/L Final         Passed - ALT in normal range and within 360 days    ALT  Date Value Ref Range Status  02/15/2023 24 9 - 46 U/L Final         Passed - AST in normal range and within 360 days    AST  Date Value Ref Range Status  02/15/2023 21 10 - 35 U/L Final         Passed - Completed PHQ-2 or PHQ-9 in the last 360 days      Passed - Valid encounter within last 12 months    Recent Outpatient Visits           2 months ago Prediabetes   [place] [facility] [place], Salvadore Oxford, NP       Future Appointments             In 3 months Baity, Salvadore Oxford, NP [facility]             phentermine 30 MG capsule [Pharmacy Med Name: Phentermine HCl 30 MG Oral Capsule] 30 capsule 0    Sig: Take 1 capsule by mouth in the morning     Not Delegated - Neurology: Anticonvulsants - Controlled - phentermine hydrochloride Failed - 04/25/2023  9:42 AM      Failed - This refill cannot be delegated      Passed - eGFR  in normal range and within 360 days    GFR calc Af Amer  Date Value Ref Range Status  09/24/2016 >60 >60 mL/min Final    Comment:    (NOTE) The eGFR has been calculated using the CKD EPI equation. This calculation has not been validated in all clinical situations. eGFR's persistently <60 mL/min signify possible Chronic Kidney Disease.    GFR calc non Af Amer  Date Value Ref Range Status  09/24/2016 >60 >60 mL/min Final   GFR  Date Value Ref Range Status  08/29/2017 111.94 >60.00 mL/min Final   eGFR  Date Value Ref Range Status  02/15/2023 107 > OR = 60 mL/min/1.2m2 Final         Passed - Cr in normal range and within 360 days    Creat  Date Value Ref Range Status  02/15/2023 0.73 0.70 - 1.30 mg/dL Final         Passed - Last BP in normal range    BP Readings from Last 1 Encounters:  02/15/23 120/62         Passed - Valid encounter within last 6 months    Recent Outpatient Visits           2 months ago Prediabetes   [place] [facility] [place], Salvadore Oxford, NP       Future Appointments             In 3 months [place], Salvadore Oxford, NP [place] [facility]            Passed - Weight completed in the last 3 months    Wt Readings from Last 1 Encounters:  02/15/23 287 lb 6.4 oz (130.4 kg)            Requested Prescriptions  Pending Prescriptions Disp Refills   topiramate (TOPAMAX) 25 MG tablet [Pharmacy Med Name: Topiramate 25 MG Oral Tablet] 90 tablet 0    Sig: Take 1 tablet by mouth twice daily     Neurology: Anticonvulsants - topiramate & zonisamide Passed - 04/25/2023  9:42 AM      Passed - Cr in normal range and within 360 days    Creat  Date Value Ref Range Status  02/15/2023 0.73 0.70 - 1.30 mg/dL Final         Passed - CO2 in normal range and within 360 days    CO2  Date Value Ref Range Status  02/15/2023 28 20 - 32 mmol/L Final         Passed - ALT in normal range and within 360 days    ALT  Date  Value Ref Range Status  02/15/2023 24 9 - 46 U/L Final         Passed - AST in normal range and within 360 days    AST  Date Value Ref Range Status  02/15/2023 21 10 - 35 U/L Final         Passed - Completed PHQ-2 or PHQ-9 in the last 360 days      Passed - Valid encounter within last 12 months    Recent Outpatient Visits           2 months ago Prediabetes   [place] [facility] [place], Salvadore Oxford, NP       Future Appointments             In 3 months Baity, Salvadore Oxford, NP  [facility]             phentermine 30 MG capsule [Pharmacy Med Name: Phentermine HCl 30 MG Oral Capsule] 30 capsule 0    Sig: Take 1 capsule by mouth in the morning     Not Delegated - Neurology: Anticonvulsants - Controlled - phentermine hydrochloride Failed - 04/25/2023  9:42 AM      Failed - This refill cannot be delegated      Passed - eGFR in normal range and within 360 days    GFR calc Af Amer  Date Value Ref Range Status  09/24/2016 >60 >60 mL/min Final    Comment:    (NOTE) The eGFR has been calculated using the CKD EPI equation. This calculation has not been validated in all clinical situations. eGFR's persistently <60 mL/min signify possible Chronic Kidney Disease.  GFR calc non Af Amer  Date Value Ref Range Status  09/24/2016 >60 >60 mL/min Final   GFR  Date Value Ref Range Status  08/29/2017 111.94 >60.00 mL/min Final   eGFR  Date Value Ref Range Status  02/15/2023 107 > OR = 60 mL/min/1.67m2 Final         Passed - Cr in normal range and within 360 days    Creat  Date Value Ref Range Status  02/15/2023 0.73 0.70 - 1.30 mg/dL Final         Passed - Last BP in normal range    BP Readings from Last 1 Encounters:  02/15/23 120/62         Passed - Valid encounter within last 6 months    Recent Outpatient Visits           2 months ago Prediabetes   [place] [facility] [place], Salvadore Oxford, NP        Future Appointments             In 3 months Baity, Salvadore Oxford, NP [facility]            Passed - Weight completed in the last 3 months    Wt Readings from Last 1 Encounters:  02/15/23 287 lb 6.4 oz (130.4 kg)

## 2023-05-01 ENCOUNTER — Other Ambulatory Visit: Payer: Self-pay | Admitting: Internal Medicine

## 2023-05-02 NOTE — Telephone Encounter (Signed)
Unable to refill per protocol, Rx request is too soon. Last refill 04/03/23 for 45 days. Duplicate request.  Requested Prescriptions  Pending Prescriptions Disp Refills   topiramate (TOPAMAX) 25 MG tablet [Pharmacy Med Name: Topiramate 25 MG Oral Tablet] 90 tablet 0    Sig: Take 1 tablet by mouth twice daily     Neurology: Anticonvulsants - topiramate & zonisamide Passed - 05/01/2023  6:49 PM      Passed - Cr in normal range and within 360 days    Creat  Date Value Ref Range Status  02/15/2023 0.73 0.70 - 1.30 mg/dL Final         Passed - CO2 in normal range and within 360 days    CO2  Date Value Ref Range Status  02/15/2023 28 20 - 32 mmol/L Final         Passed - ALT in normal range and within 360 days    ALT  Date Value Ref Range Status  02/15/2023 24 9 - 46 U/L Final         Passed - AST in normal range and within 360 days    AST  Date Value Ref Range Status  02/15/2023 21 10 - 35 U/L Final         Passed - Completed PHQ-2 or PHQ-9 in the last 360 days      Passed - Valid encounter within last 12 months    Recent Outpatient Visits           2 months ago Prediabetes   [place] [facility] [place], Salvadore Oxford, NP       Future Appointments             In 3 months Baity, Salvadore Oxford, NP [place] [facility]

## 2023-05-15 ENCOUNTER — Ambulatory Visit: Payer: 59 | Admitting: Internal Medicine

## 2023-05-15 ENCOUNTER — Encounter: Payer: Self-pay | Admitting: Internal Medicine

## 2023-05-15 VITALS — BP 106/62 | HR 94 | Temp 96.5°F | Wt 272.0 lb

## 2023-05-15 DIAGNOSIS — R1031 Right lower quadrant pain: Secondary | ICD-10-CM

## 2023-05-15 DIAGNOSIS — R3911 Hesitancy of micturition: Secondary | ICD-10-CM

## 2023-05-15 DIAGNOSIS — N5089 Other specified disorders of the male genital organs: Secondary | ICD-10-CM

## 2023-05-15 DIAGNOSIS — R350 Frequency of micturition: Secondary | ICD-10-CM

## 2023-05-15 DIAGNOSIS — R3915 Urgency of urination: Secondary | ICD-10-CM | POA: Diagnosis not present

## 2023-05-15 DIAGNOSIS — Z87442 Personal history of urinary calculi: Secondary | ICD-10-CM | POA: Diagnosis not present

## 2023-05-15 LAB — POCT URINALYSIS DIPSTICK
Glucose, UA: NEGATIVE
Ketones, UA: NEGATIVE
Nitrite, UA: NEGATIVE
Protein, UA: NEGATIVE
Spec Grav, UA: 1.025 (ref 1.010–1.025)
Urobilinogen, UA: 0.2 E.U./dL
pH, UA: 6 (ref 5.0–8.0)

## 2023-05-15 MED ORDER — CIPROFLOXACIN HCL 500 MG PO TABS: 500.0000 mg | ORAL_TABLET | Freq: Two times a day (BID) | ORAL | 0 refills | Status: AC

## 2023-05-15 MED ORDER — TAMSULOSIN HCL 0.4 MG PO CAPS: 0.4000 mg | ORAL_CAPSULE | Freq: Every day | ORAL | 1 refills | Status: DC

## 2023-05-15 NOTE — Progress Notes (Signed)
Subjective:    Patient ID: Bradley Powers, male    DOB: 1966-03-01, 57 y.o.   MRN: [id number]  HPI  Patient presents to clinic today with complaint of right lower abdominal pain, urinary frequency, urgency and urinary hesitancy.  He reports that started 1 week ago.  He reports associated testicular swelling.  He denies dysuria, burning sensation or blood in his urine.  He denies nausea or vomiting.  He was running a fever but has not run a fever in the last 16 hours.  He has a history of kidney stones and reports this feels the same.  He has taken Flomax OTC with minimal relief of symptoms.  He has increased his water intake.  Review of Systems     Past Medical History:  Diagnosis Date   Arthritis    Essential hypertension    Kidney stone    Spinal stenosis     Current Outpatient Medications  Medication Sig Dispense Refill   acetaminophen (TYLENOL) 325 MG tablet Take 650 mg by mouth every 6 (six) hours as needed.     aspirin 81 MG tablet Take 81 mg by mouth daily.     diltiazem (CARDIZEM CD) 240 MG 24 hr capsule Take 1 capsule (240 mg total) by mouth daily. 30 capsule 0   lisinopril (ZESTRIL) 20 MG tablet Take 20 mg by mouth daily.     Multiple Vitamin (MULTIVITAMIN) capsule Take 1 capsule by mouth daily.     phentermine 30 MG capsule Take 1 capsule by mouth in the morning 30 capsule 0   topiramate (TOPAMAX) 25 MG tablet Take 1 tablet (25 mg total) by mouth 2 (two) times daily. 90 tablet 0   TURMERIC PO Take 1 capsule by mouth daily.     No current facility-administered medications for this visit.    No Known Allergies  Family History  Problem Relation Age of Onset   Lung cancer Mother    Heart attack Father    Healthy Sister    Healthy Brother     Social History   Socioeconomic History   Marital status: Married    Spouse name: Not on file   Number of children: Not on file   Years of education: Not on file   Highest education level: Not on file  Occupational  History   Occupation: Human resources officer  Tobacco Use   Smoking status: Never   Smokeless tobacco: Never  Vaping Use   Vaping Use: Never used  Substance and Sexual Activity   Alcohol use: Yes    Alcohol/week: 0.0 standard drinks of alcohol    Comment: occ   Drug use: No   Sexual activity: Not on file  Other Topics Concern   Not on file  Social History Narrative   Married.   4 children.    Works in Airline pilot.   Enjoys DJ's, playing in a band.    Social Determinants of Health   Financial Resource Strain: Not on file  Food Insecurity: Not on file  Transportation Needs: Not on file  Physical Activity: Not on file  Stress: Not on file  Social Connections: Not on file  Intimate Partner Violence: Not on file     Constitutional: Denies fever, malaise, fatigue, headache or abrupt weight changes.  HEENT: Denies eye pain, eye redness, ear pain, ringing in the ears, wax buildup, runny nose, nasal congestion, bloody nose, or sore throat. Respiratory: Denies difficulty breathing, shortness of breath, cough or sputum production.   Cardiovascular: Denies  chest pain, chest tightness, palpitations or swelling in the hands or feet.  Gastrointestinal: Patient reports right lower abdominal pain.  Denies bloating, constipation, diarrhea or blood in the stool.  GU: Patient reports urinary urgency, frequency, hesitancy and testicular swelling.  Denies  pain with urination, burning sensation, blood in urine, odor or discharge.  No other specific complaints in a complete review of systems (except as listed in HPI above).  Objective:   Physical Exam  BP 106/62 (BP Location: Left Arm, Patient Position: Sitting, Cuff Size: Large)   Pulse 94   Temp (!) 96.5 F (35.8 C) (Temporal)   Wt 272 lb (123.4 kg)   SpO2 99%   BMI 40.17 kg/m   Wt Readings from Last 3 Encounters:  02/15/23 287 lb 6.4 oz (130.4 kg)  08/29/17 (!) 314 lb 12.8 oz (142.8 kg)  09/23/16 (!) 304 lb 14.4 oz (138.3 kg)     General: Appears his stated age, obese, in NAD. Cardiovascular: Normal rate and rhythm. Pulmonary/Chest: Normal effort and positive vesicular breath sounds. No respiratory distress. No wheezes, rales or ronchi noted.  Abdomen: Soft and mildly tender in the right lower quadrant. Normal bowel sounds. No CVA numbness noted. GU: Deferred Neurological: Alert and oriented.    BMET    Component Value Date/Time   NA 141 02/15/2023 0839   K 3.8 02/15/2023 0839   CL 104 02/15/2023 0839   CO2 28 02/15/2023 0839   GLUCOSE 107 02/15/2023 0839   BUN 17 02/15/2023 0839   CREATININE 0.73 02/15/2023 0839   CALCIUM 9.3 02/15/2023 0839   GFRNONAA >60 09/24/2016 0551   GFRAA >60 09/24/2016 0551    Lipid Panel     Component Value Date/Time   CHOL 142 02/15/2023 0839   TRIG 196 (H) 02/15/2023 0839   HDL 29 (L) 02/15/2023 0839   CHOLHDL 4.9 02/15/2023 0839   LDLCALC 84 02/15/2023 0839    CBC    Component Value Date/Time   WBC 6.8 02/15/2023 0839   RBC 4.85 02/15/2023 0839   HGB 13.4 02/15/2023 0839   HCT 40.2 02/15/2023 0839   PLT 271 02/15/2023 0839   MCV 82.9 02/15/2023 0839   MCH 27.6 02/15/2023 0839   MCHC 33.3 02/15/2023 0839   RDW 13.4 02/15/2023 0839    Hgb A1C Lab Results  Component Value Date   HGBA1C 5.3 02/15/2023           Assessment & Plan:   RLQ abdominal pain, urinary urgency, frequency, hesitancy, testicular swelling and history of kidney stones:  Urinalysis: Moderate leuks, moderate blood We will send urine culture Encouraged him to push fluids Rx for Cipro 500 mg twice daily x 7 days Rx for Flomax 0.4 mg caps daily If worsens, would consider CT renal stone study and possibly scrotal ultrasound  RTC in 3 months for annual exam Nicki Reaper, NP

## 2023-05-16 LAB — URINE CULTURE
MICRO NUMBER:: 15176753
Result:: NO GROWTH
SPECIMEN QUALITY:: ADEQUATE

## 2023-05-17 ENCOUNTER — Other Ambulatory Visit: Payer: Self-pay | Admitting: Internal Medicine

## 2023-05-17 ENCOUNTER — Ambulatory Visit
Admission: RE | Admit: 2023-05-17 | Discharge: 2023-05-17 | Disposition: A | Discharge status: Home / Self Care | Payer: 59 | Source: Ambulatory Visit | Attending: Internal Medicine | Admitting: Internal Medicine

## 2023-05-17 DIAGNOSIS — N5089 Other specified disorders of the male genital organs: Secondary | ICD-10-CM

## 2023-05-17 DIAGNOSIS — N5082 Scrotal pain: Secondary | ICD-10-CM | POA: Diagnosis not present

## 2023-05-20 ENCOUNTER — Other Ambulatory Visit: Payer: Self-pay | Admitting: Internal Medicine

## 2023-05-21 NOTE — Telephone Encounter (Signed)
Requested Prescriptions  Pending Prescriptions Disp Refills   topiramate (TOPAMAX) 25 MG tablet [Pharmacy Med Name: Topiramate 25 MG Oral Tablet] 180 tablet 1    Sig: Take 1 tablet by mouth twice daily     Neurology: Anticonvulsants - topiramate & zonisamide Passed - 05/20/2023  5:25 PM      Passed - Cr in normal range and within 360 days    Creat  Date Value Ref Range Status  02/15/2023 0.73 0.70 - 1.30 mg/dL Final         Passed - CO2 in normal range and within 360 days    CO2  Date Value Ref Range Status  02/15/2023 28 20 - 32 mmol/L Final         Passed - ALT in normal range and within 360 days    ALT  Date Value Ref Range Status  02/15/2023 24 9 - 46 U/L Final         Passed - AST in normal range and within 360 days    AST  Date Value Ref Range Status  02/15/2023 21 10 - 35 U/L Final         Passed - Completed PHQ-2 or PHQ-9 in the last 360 days      Passed - Valid encounter within last 12 months    Recent Outpatient Visits           6 days ago RLQ abdominal pain   [facility] [place], Salvadore Oxford, NP   3 months ago Prediabetes   [place] [facility] [place], Salvadore Oxford, NP       Future Appointments             In 3 months Baity, Salvadore Oxford, NP [place] [facility]

## 2023-05-28 ENCOUNTER — Other Ambulatory Visit: Payer: Self-pay | Admitting: Internal Medicine

## 2023-05-28 MED ORDER — PHENTERMINE HCL 30 MG PO CAPS: 30.0000 mg | ORAL_CAPSULE | Freq: Every morning | ORAL | 0 refills | Status: DC

## 2023-06-17 ENCOUNTER — Other Ambulatory Visit: Payer: Self-pay | Admitting: Internal Medicine

## 2023-06-19 NOTE — Telephone Encounter (Signed)
Requested medications are due for refill today.  A little too soon  Requested medications are on the active medications list.  yes  Last refill. 05/28/2023 #30 0 rf  Future visit scheduled.   yes  Notes to clinic.  Refill not delegated.    Requested Prescriptions  Pending Prescriptions Disp Refills   phentermine 30 MG capsule [Pharmacy Med Name: Phentermine HCl 30 MG Oral Capsule] 30 capsule 0    Sig: Take 1 capsule by mouth in the morning     Not Delegated - Neurology: Anticonvulsants - Controlled - phentermine hydrochloride Failed - 06/17/2023  5:14 PM      Failed - This refill cannot be delegated      Passed - eGFR in normal range and within 360 days    GFR calc Af Amer  Date Value Ref Range Status  09/24/2016 >60 >60 mL/min Final    Comment:    (NOTE) The eGFR has been calculated using the CKD EPI equation. This calculation has not been validated in all clinical situations. eGFR's persistently <60 mL/min signify possible Chronic Kidney Disease.    GFR calc non Af Amer  Date Value Ref Range Status  09/24/2016 >60 >60 mL/min Final   GFR  Date Value Ref Range Status  08/29/2017 111.94 >60.00 mL/min Final   eGFR  Date Value Ref Range Status  02/15/2023 107 > OR = 60 mL/min/1.6m2 Final         Passed - Cr in normal range and within 360 days    Creat  Date Value Ref Range Status  02/15/2023 0.73 0.70 - 1.30 mg/dL Final         Passed - Last BP in normal range    BP Readings from Last 1 Encounters:  05/15/23 106/62         Passed - Valid encounter within last 6 months    Recent Outpatient Visits           1 month ago RLQ abdominal pain   [place] [facility] [place], Salvadore Oxford, NP   4 months ago Prediabetes   [place] [facility] [place], Salvadore Oxford, NP       Future Appointments             In 2 months Baity, Salvadore Oxford, NP [place] [facility]            Passed - Weight completed in the last  3 months    Wt Readings from Last 1 Encounters:  05/15/23 272 lb (123.4 kg)

## 2023-06-26 ENCOUNTER — Other Ambulatory Visit: Payer: Self-pay | Admitting: Internal Medicine

## 2023-06-26 MED ORDER — VALACYCLOVIR HCL 1 G PO TABS: 1000.0000 mg | ORAL_TABLET | Freq: Three times a day (TID) | ORAL | 0 refills | Status: DC

## 2023-08-10 ENCOUNTER — Other Ambulatory Visit: Payer: Self-pay | Admitting: Internal Medicine

## 2023-08-10 DIAGNOSIS — Z1211 Encounter for screening for malignant neoplasm of colon: Secondary | ICD-10-CM

## 2023-08-10 DIAGNOSIS — Z1212 Encounter for screening for malignant neoplasm of rectum: Secondary | ICD-10-CM

## 2023-08-14 ENCOUNTER — Other Ambulatory Visit: Payer: Self-pay | Admitting: Internal Medicine

## 2023-08-15 NOTE — Telephone Encounter (Signed)
Requested medication (s) are due for refill today: Yes  Requested medication (s) are on the active medication list: yes    Last refill: 06/20/23  #30  0 refills  Future visit scheduled no  Notes to clinic:Not delegated, please review.  Requested Prescriptions  Pending Prescriptions Disp Refills   phentermine 30 MG capsule [Pharmacy Med Name: Phentermine HCl 30 MG Oral Capsule] 30 capsule 0    Sig: Take 1 capsule by mouth in the morning     Not Delegated - Neurology: Anticonvulsants - Controlled - phentermine hydrochloride Failed - 08/14/2023  8:48 PM      Failed - This refill cannot be delegated      Failed - Weight completed in the last 3 months    Wt Readings from Last 1 Encounters:  05/15/23 272 lb (123.4 kg)         Passed - eGFR in normal range and within 360 days    GFR calc Af Amer  Date Value Ref Range Status  09/24/2016 >60 >60 mL/min Final    Comment:    (NOTE) The eGFR has been calculated using the CKD EPI equation. This calculation has not been validated in all clinical situations. eGFR's persistently <60 mL/min signify possible Chronic Kidney Disease.    GFR calc non Af Amer  Date Value Ref Range Status  09/24/2016 >60 >60 mL/min Final   GFR  Date Value Ref Range Status  08/29/2017 111.94 >60.00 mL/min Final   eGFR  Date Value Ref Range Status  02/15/2023 107 > OR = 60 mL/min/1.53m2 Final         Passed - Cr in normal range and within 360 days    Creat  Date Value Ref Range Status  02/15/2023 0.73 0.70 - 1.30 mg/dL Final         Passed - Last BP in normal range    BP Readings from Last 1 Encounters:  05/15/23 106/62         Passed - Valid encounter within last 6 months    Recent Outpatient Visits           3 months ago RLQ abdominal pain   [place] [facility] [place], Salvadore Oxford, NP   6 months ago Prediabetes   [facility] [place], Salvadore Oxford, [place]

## 2023-08-20 ENCOUNTER — Encounter: Payer: 59 | Admitting: Internal Medicine

## 2023-08-30 ENCOUNTER — Ambulatory Visit: Payer: Self-pay | Admitting: Internal Medicine

## 2023-09-30 ENCOUNTER — Other Ambulatory Visit: Payer: Self-pay | Admitting: Internal Medicine

## 2023-10-02 NOTE — Telephone Encounter (Signed)
Requested medication (s) are due for refill today - provider review   Requested medication (s) are on the active medication list -yes  Future visit scheduled -no  Last refill: 08/15/23 #30  Notes to clinic: non delegated Rx  Requested Prescriptions  Pending Prescriptions Disp Refills   phentermine 30 MG capsule [Pharmacy Med Name: Phentermine HCl 30 MG Oral Capsule] 30 capsule 0    Sig: Take 1 capsule by mouth in the morning     Not Delegated - Neurology: Anticonvulsants - Controlled - phentermine hydrochloride Failed - 09/30/2023  2:51 PM      Failed - This refill cannot be delegated      Failed - Weight completed in the last 3 months    Wt Readings from Last 1 Encounters:  05/15/23 272 lb (123.4 kg)         Passed - eGFR in normal range and within 360 days    GFR calc Af Amer  Date Value Ref Range Status  09/24/2016 >60 >60 mL/min Final    Comment:    (NOTE) The eGFR has been calculated using the CKD EPI equation. This calculation has not been validated in all clinical situations. eGFR's persistently <60 mL/min signify possible Chronic Kidney Disease.    GFR calc non Af Amer  Date Value Ref Range Status  09/24/2016 >60 >60 mL/min Final   GFR  Date Value Ref Range Status  08/29/2017 111.94 >60.00 mL/min Final   eGFR  Date Value Ref Range Status  02/15/2023 107 > OR = 60 mL/min/1.28m2 Final         Passed - Cr in normal range and within 360 days    Creat  Date Value Ref Range Status  02/15/2023 0.73 0.70 - 1.30 mg/dL Final         Passed - Last BP in normal range    BP Readings from Last 1 Encounters:  05/15/23 106/62         Passed - Valid encounter within last 6 months    Recent Outpatient Visits           4 months ago RLQ abdominal pain   [place] [facility] [place], Salvadore Oxford, NP   7 months ago Prediabetes   [place] [facility] [place], Salvadore Oxford, NP                 Requested Prescriptions  Pending  Prescriptions Disp Refills   phentermine 30 MG capsule [Pharmacy Med Name: Phentermine HCl 30 MG Oral Capsule] 30 capsule 0    Sig: Take 1 capsule by mouth in the morning     Not Delegated - Neurology: Anticonvulsants - Controlled - phentermine hydrochloride Failed - 09/30/2023  2:51 PM      Failed - This refill cannot be delegated      Failed - Weight completed in the last 3 months    Wt Readings from Last 1 Encounters:  05/15/23 272 lb (123.4 kg)         Passed - eGFR in normal range and within 360 days    GFR calc Af Amer  Date Value Ref Range Status  09/24/2016 >60 >60 mL/min Final    Comment:    (NOTE) The eGFR has been calculated using the CKD EPI equation. This calculation has not been validated in all clinical situations. eGFR's persistently <60 mL/min signify possible Chronic Kidney Disease.    GFR calc non Af Amer  Date Value Ref Range Status  09/24/2016 >60 >60 mL/min Final   GFR  Date Value Ref Range Status  08/29/2017 111.94 >60.00 mL/min Final   eGFR  Date Value Ref Range Status  02/15/2023 107 > OR = 60 mL/min/1.93m2 Final         Passed - Cr in normal range and within 360 days    Creat  Date Value Ref Range Status  02/15/2023 0.73 0.70 - 1.30 mg/dL Final         Passed - Last BP in normal range    BP Readings from Last 1 Encounters:  05/15/23 106/62         Passed - Valid encounter within last 6 months    Recent Outpatient Visits           4 months ago RLQ abdominal pain   [place] [facility] [place], Salvadore Oxford, NP   7 months ago Prediabetes   [place] [facility] [place], Salvadore Oxford, [place]

## 2023-10-28 ENCOUNTER — Other Ambulatory Visit: Payer: Self-pay | Admitting: Internal Medicine

## 2023-10-30 NOTE — Telephone Encounter (Signed)
Requested medication (s) are due for refill today - provider review   Requested medication (s) are on the active medication list -yes  Future visit scheduled -no  Last refill: 08/15/23 #30  Notes to clinic: non delegated Rx  Requested Prescriptions  Pending Prescriptions Disp Refills   phentermine 30 MG capsule [Pharmacy Med Name: Phentermine HCl 30 MG Oral Capsule] 30 capsule 0    Sig: Take 1 capsule by mouth in the morning     Not Delegated - Neurology: Anticonvulsants - Controlled - phentermine hydrochloride Failed - 10/30/2023  8:54 AM      Failed - This refill cannot be delegated      Failed - Weight completed in the last 3 months    Wt Readings from Last 1 Encounters:  05/15/23 272 lb (123.4 kg)         Passed - eGFR in normal range and within 360 days    GFR calc Af Amer  Date Value Ref Range Status  09/24/2016 >60 >60 mL/min Final    Comment:    (NOTE) The eGFR has been calculated using the CKD EPI equation. This calculation has not been validated in all clinical situations. eGFR's persistently <60 mL/min signify possible Chronic Kidney Disease.    GFR calc non Af Amer  Date Value Ref Range Status  09/24/2016 >60 >60 mL/min Final   GFR  Date Value Ref Range Status  08/29/2017 111.94 >60.00 mL/min Final   eGFR  Date Value Ref Range Status  02/15/2023 107 > OR = 60 mL/min/1.38m2 Final         Passed - Cr in normal range and within 360 days    Creat  Date Value Ref Range Status  02/15/2023 0.73 0.70 - 1.30 mg/dL Final         Passed - Last BP in normal range    BP Readings from Last 1 Encounters:  05/15/23 106/62         Passed - Valid encounter within last 6 months    Recent Outpatient Visits           5 months ago RLQ abdominal pain   [place] [facility] [place], Salvadore Oxford, NP   8 months ago Prediabetes   [place] [facility] [place], Salvadore Oxford, NP                 Requested Prescriptions  Pending  Prescriptions Disp Refills   phentermine 30 MG capsule [Pharmacy Med Name: Phentermine HCl 30 MG Oral Capsule] 30 capsule 0    Sig: Take 1 capsule by mouth in the morning     Not Delegated - Neurology: Anticonvulsants - Controlled - phentermine hydrochloride Failed - 10/30/2023  8:54 AM      Failed - This refill cannot be delegated      Failed - Weight completed in the last 3 months    Wt Readings from Last 1 Encounters:  05/15/23 272 lb (123.4 kg)         Passed - eGFR in normal range and within 360 days    GFR calc Af Amer  Date Value Ref Range Status  09/24/2016 >60 >60 mL/min Final    Comment:    (NOTE) The eGFR has been calculated using the CKD EPI equation. This calculation has not been validated in all clinical situations. eGFR's persistently <60 mL/min signify possible Chronic Kidney Disease.    GFR calc non Af Amer  Date Value Ref Range Status  09/24/2016 >60 >60 mL/min Final   GFR  Date Value Ref Range Status  08/29/2017 111.94 >60.00 mL/min Final   eGFR  Date Value Ref Range Status  02/15/2023 107 > OR = 60 mL/min/1.24m2 Final         Passed - Cr in normal range and within 360 days    Creat  Date Value Ref Range Status  02/15/2023 0.73 0.70 - 1.30 mg/dL Final         Passed - Last BP in normal range    BP Readings from Last 1 Encounters:  05/15/23 106/62         Passed - Valid encounter within last 6 months    Recent Outpatient Visits           5 months ago RLQ abdominal pain   [place] [facility] [place], Salvadore Oxford, NP   8 months ago Prediabetes   [facility] [place], Salvadore Oxford, [place]

## 2023-11-21 ENCOUNTER — Other Ambulatory Visit: Payer: Self-pay

## 2023-11-21 MED ORDER — TAMSULOSIN HCL 0.4 MG PO CAPS: 0.4000 mg | ORAL_CAPSULE | Freq: Every day | ORAL | 1 refills | Status: DC

## 2023-11-23 ENCOUNTER — Encounter: Payer: Self-pay | Admitting: Internal Medicine

## 2023-11-23 ENCOUNTER — Ambulatory Visit (INDEPENDENT_AMBULATORY_CARE_PROVIDER_SITE_OTHER): Payer: 59 | Admitting: Internal Medicine

## 2023-11-23 VITALS — BP 110/68 | Ht 69.0 in | Wt 265.0 lb

## 2023-11-23 DIAGNOSIS — R7303 Prediabetes: Secondary | ICD-10-CM | POA: Diagnosis not present

## 2023-11-23 DIAGNOSIS — Z0001 Encounter for general adult medical examination with abnormal findings: Secondary | ICD-10-CM

## 2023-11-23 DIAGNOSIS — Z125 Encounter for screening for malignant neoplasm of prostate: Secondary | ICD-10-CM

## 2023-11-23 DIAGNOSIS — E66812 Obesity, class 2: Secondary | ICD-10-CM

## 2023-11-23 DIAGNOSIS — Z136 Encounter for screening for cardiovascular disorders: Secondary | ICD-10-CM | POA: Diagnosis not present

## 2023-11-23 DIAGNOSIS — Z6839 Body mass index (BMI) 39.0-39.9, adult: Secondary | ICD-10-CM | POA: Diagnosis not present

## 2023-11-23 MED ORDER — PHENTERMINE HCL 30 MG PO CAPS: 30.0000 mg | ORAL_CAPSULE | Freq: Every morning | ORAL | 0 refills | Status: DC

## 2023-11-23 MED ORDER — TOPIRAMATE 25 MG PO TABS: 25.0000 mg | ORAL_TABLET | Freq: Two times a day (BID) | ORAL | 1 refills | Status: DC

## 2023-11-23 NOTE — Progress Notes (Signed)
HPI  Patient presents to clinic today for his annual exam.  Flu: 09/2016 Tetanus: < 10 years COVID: x 2 Shingrix: never PSA screening: never Colon screening: never Vision screening: annually Dentist: biannually  Diet: He does eat meat. He consumes more veggies than fruits. He does eat fried foods. He drinks mostly water, dt soda. Exercise: None   Past Medical History:  Diagnosis Date   Arthritis    Essential hypertension    Kidney stone    Spinal stenosis     Current Outpatient Medications  Medication Sig Dispense Refill   acetaminophen (TYLENOL) 325 MG tablet Take 650 mg by mouth every 6 (six) hours as needed.     aspirin 81 MG tablet Take 81 mg by mouth daily.     diltiazem (CARDIZEM CD) 240 MG 24 hr capsule Take 1 capsule (240 mg total) by mouth daily. 30 capsule 0   lisinopril (ZESTRIL) 20 MG tablet Take 20 mg by mouth daily.     Multiple Vitamin (MULTIVITAMIN) capsule Take 1 capsule by mouth daily.     phentermine 30 MG capsule Take 1 capsule by mouth in the morning 30 capsule 0   tamsulosin (FLOMAX) 0.4 MG CAPS capsule Take 1 capsule (0.4 mg total) by mouth daily. 90 capsule 1   topiramate (TOPAMAX) 25 MG tablet Take 1 tablet by mouth twice daily 180 tablet 1   TURMERIC PO Take 1 capsule by mouth daily.     valACYclovir (VALTREX) 1000 MG tablet Take 1 tablet (1,000 mg total) by mouth 3 (three) times daily. For shingles 21 tablet 0   No current facility-administered medications for this visit.    No Known Allergies  Family History  Problem Relation Age of Onset   Lung cancer Mother    Heart attack Father    Healthy Sister    Healthy Brother     Social History   Socioeconomic History   Marital status: Married    Spouse name: Not on file   Number of children: Not on file   Years of education: Not on file   Highest education level: Not on file  Occupational History   Occupation: Human resources officer  Tobacco Use   Smoking status: Never   Smokeless  tobacco: Never  Vaping Use   Vaping status: Never Used  Substance and Sexual Activity   Alcohol use: Yes    Alcohol/week: 0.0 standard drinks of alcohol    Comment: occ   Drug use: No   Sexual activity: Not on file  Other Topics Concern   Not on file  Social History Narrative   Married.   4 children.    Works in Airline pilot.   Enjoys DJ's, playing in a band.    Social Drivers of Corporate investment banker Strain: Not on file  Food Insecurity: Not on file  Transportation Needs: Not on file  Physical Activity: Not on file  Stress: Not on file  Social Connections: Not on file  Intimate Partner Violence: Not on file    ROS:  Constitutional: Denies fever, malaise, fatigue, headache or abrupt weight changes.  HEENT: Denies eye pain, eye redness, ear pain, ringing in the ears, wax buildup, runny nose, nasal congestion, bloody nose, or sore throat. Respiratory: Denies difficulty breathing, shortness of breath, cough or sputum production.   Cardiovascular: Denies chest pain, chest tightness, palpitations or swelling in the hands or feet.  Gastrointestinal: Denies abdominal pain, bloating, constipation, diarrhea or blood in the stool.  GU: Denies frequency, urgency,  pain with urination, blood in urine, odor or discharge. Musculoskeletal: Patient reports intermittent joint pain.  Denies decrease in range of motion, difficulty with gait, muscle pain or joint swelling.  Skin: Denies redness, rashes, lesions or ulcercations.  Neurological: Denies dizziness, difficulty with memory, difficulty with speech or problems with balance and coordination.  Psych: Denies anxiety, depression, SI/HI.  No other specific complaints in a complete review of systems (except as listed in HPI above).  PE:  BP 110/68 (BP Location: Left Arm, Patient Position: Sitting, Cuff Size: Large)   Ht 5\' 9"  (1.753 m)   Wt 265 lb (120.2 kg)   BMI 39.13 kg/m    Wt Readings from Last 3 Encounters:  05/15/23 272 lb  (123.4 kg)  02/15/23 287 lb 6.4 oz (130.4 kg)  08/29/17 (!) 314 lb 12.8 oz (142.8 kg)    General: Appears his stated age, obese, in NAD. Skin: Warm, dry and intact. No redness, rashes, lesion or ulcerations noted. HEENT: Head: normal shape and size; Eyes: sclera white, no icterus, conjunctiva pink, PERRLA and EOMs intact;  Neck: Trachea midline, no masses, lumps or thyromegaly noted. Cardiovascular: Normal rate and rhythm. S1,S2 noted.  No murmur, rubs or gallops noted. No JVD or BLE edema. No carotid bruits noted. Pulmonary/Chest: Normal effort and positive vesicular breath sounds. No respiratory distress. No wheezes, rales or ronchi noted.  Abdomen: Soft, nontender. Active bowel sounds. No distention or masses noted. Musculoskeletal: Joint deformity noted in hands. Strength 5/5 BUE/BLE. No difficulty with gait.  Neurological: Alert and oriented. Cranial nerves II-XII grossly intact. Coordination normal.  Psychiatric: Mood and affect normal. Behavior is normal. Judgment and thought content normal.     BMET    Component Value Date/Time   NA 141 02/15/2023 0839   K 3.8 02/15/2023 0839   CL 104 02/15/2023 0839   CO2 28 02/15/2023 0839   GLUCOSE 107 02/15/2023 0839   BUN 17 02/15/2023 0839   CREATININE 0.73 02/15/2023 0839   CALCIUM 9.3 02/15/2023 0839   GFRNONAA >60 09/24/2016 0551   GFRAA >60 09/24/2016 0551    Lipid Panel     Component Value Date/Time   CHOL 142 02/15/2023 0839   TRIG 196 (H) 02/15/2023 0839   HDL 29 (L) 02/15/2023 0839   CHOLHDL 4.9 02/15/2023 0839   LDLCALC 84 02/15/2023 0839    CBC    Component Value Date/Time   WBC 6.8 02/15/2023 0839   RBC 4.85 02/15/2023 0839   HGB 13.4 02/15/2023 0839   HCT 40.2 02/15/2023 0839   PLT 271 02/15/2023 0839   MCV 82.9 02/15/2023 0839   MCH 27.6 02/15/2023 0839   MCHC 33.3 02/15/2023 0839   RDW 13.4 02/15/2023 0839    Hgb A1C Lab Results  Component Value Date   HGBA1C 5.3 02/15/2023     Assessment and  Plan:  Preventative health maintenance:  Flu shot declined Tetanus UTD per his report Encouraged him to get his COVID-vaccine Discussed Shingrix vaccine, he will check coverage with his insurance company and schedule visit if he would like to have this done Cologuard previously ordered, he will contact the cologuard rep to make sure this isn't expired. If so, will send him a new kit in the mail Encouraged him to consume a balanced diet and exercise regimen Advised him to see an eye doctor and dentist annually Will check CBC, c-Met, lipid, A1c and PSA today  RTC in 6 months for follow-up of chronic conditions Nicki Reaper, NP

## 2023-11-23 NOTE — Assessment & Plan Note (Signed)
Encourage diet and exercise for weight loss 

## 2023-11-23 NOTE — Addendum Note (Signed)
Addended by: Lorre Munroe on: 11/23/2023 03:09 PM   Modules accepted: Orders

## 2023-11-23 NOTE — Patient Instructions (Signed)
Health Maintenance, Male Adopting a healthy lifestyle and getting preventive care are important in promoting health and wellness. Ask your health care provider about: The right schedule for you to have regular tests and exams. Things you can do on your own to prevent diseases and keep yourself healthy. What should I know about diet, weight, and exercise? Eat a healthy diet  Eat a diet that includes plenty of vegetables, fruits, low-fat dairy products, and lean protein. Do not eat a lot of foods that are high in solid fats, added sugars, or sodium. Maintain a healthy weight Body mass index (BMI) is a measurement that can be used to identify possible weight problems. It estimates body fat based on height and weight. Your health care provider can help determine your BMI and help you achieve or maintain a healthy weight. Get regular exercise Get regular exercise. This is one of the most important things you can do for your health. Most adults should: Exercise for at least 150 minutes each week. The exercise should increase your heart rate and make you sweat (moderate-intensity exercise). Do strengthening exercises at least twice a week. This is in addition to the moderate-intensity exercise. Spend less time sitting. Even light physical activity can be beneficial. Watch cholesterol and blood lipids Have your blood tested for lipids and cholesterol at 57 years of age, then have this test every 5 years. You may need to have your cholesterol levels checked more often if: Your lipid or cholesterol levels are high. You are older than 57 years of age. You are at high risk for heart disease. What should I know about cancer screening? Many types of cancers can be detected early and may often be prevented. Depending on your health history and family history, you may need to have cancer screening at various ages. This may include screening for: Colorectal cancer. Prostate cancer. Skin cancer. Lung  cancer. What should I know about heart disease, diabetes, and high blood pressure? Blood pressure and heart disease High blood pressure causes heart disease and increases the risk of stroke. This is more likely to develop in people who have high blood pressure readings or are overweight. Talk with your health care provider about your target blood pressure readings. Have your blood pressure checked: Every 3-5 years if you are 18-39 years of age. Every year if you are 40 years old or older. If you are between the ages of 65 and 75 and are a current or former smoker, ask your health care provider if you should have a one-time screening for abdominal aortic aneurysm (AAA). Diabetes Have regular diabetes screenings. This checks your fasting blood sugar level. Have the screening done: Once every three years after age 45 if you are at a normal weight and have a low risk for diabetes. More often and at a younger age if you are overweight or have a high risk for diabetes. What should I know about preventing infection? Hepatitis B If you have a higher risk for hepatitis B, you should be screened for this virus. Talk with your health care provider to find out if you are at risk for hepatitis B infection. Hepatitis C Blood testing is recommended for: Everyone born from 1945 through 1965. Anyone with known risk factors for hepatitis C. Sexually transmitted infections (STIs) You should be screened each year for STIs, including gonorrhea and chlamydia, if: You are sexually active and are younger than 57 years of age. You are older than 57 years of age and your   health care provider tells you that you are at risk for this type of infection. Your sexual activity has changed since you were last screened, and you are at increased risk for chlamydia or gonorrhea. Ask your health care provider if you are at risk. Ask your health care provider about whether you are at high risk for HIV. Your health care provider  may recommend a prescription medicine to help prevent HIV infection. If you choose to take medicine to prevent HIV, you should first get tested for HIV. You should then be tested every 3 months for as long as you are taking the medicine. Follow these instructions at home: Alcohol use Do not drink alcohol if your health care provider tells you not to drink. If you drink alcohol: Limit how much you have to 0-2 drinks a day. Know how much alcohol is in your drink. In the [place] one drink equals one 12 oz bottle of beer (355 mL), one 5 oz glass of wine (148 mL), or one 1 oz glass of hard liquor (44 mL). Lifestyle Do not use any products that contain nicotine or tobacco. These products include cigarettes, chewing tobacco, and vaping devices, such as e-cigarettes. If you need help quitting, ask your health care provider. Do not use street drugs. Do not share needles. Ask your health care provider for help if you need support or information about quitting drugs. General instructions Schedule regular health, dental, and eye exams. Stay current with your vaccines. Tell your health care provider if: You often feel depressed. You have ever been abused or do not feel safe at home. Summary Adopting a healthy lifestyle and getting preventive care are important in promoting health and wellness. Follow your health care provider's instructions about healthy diet, exercising, and getting tested or screened for diseases. Follow your health care provider's instructions on monitoring your cholesterol and blood pressure. This information is not intended to replace advice given to you by your health care provider. Make sure you discuss any questions you have with your health care provider. Document Revised: 03/14/2021 Document Reviewed: 03/14/2021 Elsevier Patient Education  2024 Elsevier Inc.  

## 2023-11-24 LAB — CBC
HCT: 39 % (ref 38.5–50.0)
Hemoglobin: 13.6 g/dL (ref 13.2–17.1)
MCH: 29.2 pg (ref 27.0–33.0)
MCHC: 34.9 g/dL (ref 32.0–36.0)
MCV: 83.7 fL (ref 80.0–100.0)
MPV: 9.1 fL (ref 7.5–12.5)
Platelets: 265 10*3/uL (ref 140–400)
RBC: 4.66 10*6/uL (ref 4.20–5.80)
RDW: 12.9 % (ref 11.0–15.0)
WBC: 13.7 10*3/uL — ABNORMAL HIGH (ref 3.8–10.8)

## 2023-11-24 LAB — COMPLETE METABOLIC PANEL WITH GFR
AG Ratio: 1.9 (calc) (ref 1.0–2.5)
ALT: 12 U/L (ref 9–46)
AST: 12 U/L (ref 10–35)
Albumin: 4.3 g/dL (ref 3.6–5.1)
Alkaline phosphatase (APISO): 74 U/L (ref 35–144)
BUN: 18 mg/dL (ref 7–25)
CO2: 23 mmol/L (ref 20–32)
Calcium: 9 mg/dL (ref 8.6–10.3)
Chloride: 108 mmol/L (ref 98–110)
Creat: 0.73 mg/dL (ref 0.70–1.30)
Globulin: 2.3 g/dL (ref 1.9–3.7)
Glucose, Bld: 99 mg/dL (ref 65–139)
Potassium: 3.5 mmol/L (ref 3.5–5.3)
Sodium: 141 mmol/L (ref 135–146)
Total Bilirubin: 0.4 mg/dL (ref 0.2–1.2)
Total Protein: 6.6 g/dL (ref 6.1–8.1)
eGFR: 107 mL/min/{1.73_m2} (ref >=60)

## 2023-11-24 LAB — LIPID PANEL
Cholesterol: 123 mg/dL (ref <200)
HDL: 28 mg/dL — ABNORMAL LOW (ref >=40)
LDL Cholesterol (Calc): 72 mg/dL
Non-HDL Cholesterol (Calc): 95 mg/dL (ref <130)
Total CHOL/HDL Ratio: 4.4 (calc) (ref <5.0)
Triglycerides: 151 mg/dL — ABNORMAL HIGH (ref <150)

## 2023-11-24 LAB — HEMOGLOBIN A1C
Hgb A1c MFr Bld: 5.3 %{Hb} (ref <5.7)
Mean Plasma Glucose: 105 mg/dL
eAG (mmol/L): 5.8 mmol/L

## 2023-11-24 LAB — PSA: PSA: 2.15 ng/mL (ref <=4.00)

## 2023-11-26 ENCOUNTER — Encounter: Payer: Self-pay | Admitting: Internal Medicine

## 2023-12-16 DIAGNOSIS — R0789 Other chest pain: Secondary | ICD-10-CM | POA: Diagnosis not present

## 2023-12-16 DIAGNOSIS — R079 Chest pain, unspecified: Secondary | ICD-10-CM | POA: Diagnosis not present

## 2023-12-16 DIAGNOSIS — I10 Essential (primary) hypertension: Secondary | ICD-10-CM | POA: Diagnosis not present

## 2023-12-16 DIAGNOSIS — R200 Anesthesia of skin: Secondary | ICD-10-CM | POA: Diagnosis not present

## 2023-12-20 ENCOUNTER — Encounter: Payer: Self-pay | Admitting: Cardiology

## 2023-12-20 ENCOUNTER — Ambulatory Visit: Payer: 59 | Attending: Cardiology | Admitting: Cardiology

## 2023-12-20 VITALS — BP 100/60 | HR 70 | Ht 69.0 in | Wt 260.0 lb

## 2023-12-20 DIAGNOSIS — R072 Precordial pain: Secondary | ICD-10-CM

## 2023-12-20 DIAGNOSIS — I480 Paroxysmal atrial fibrillation: Secondary | ICD-10-CM

## 2023-12-20 DIAGNOSIS — I10 Essential (primary) hypertension: Secondary | ICD-10-CM | POA: Diagnosis not present

## 2023-12-20 MED ORDER — METOPROLOL TARTRATE 100 MG PO TABS: 100.0000 mg | ORAL_TABLET | Freq: Once | ORAL | 0 refills | Status: DC

## 2023-12-20 NOTE — Patient Instructions (Signed)
 Medication Instructions:   STOP lisinopril-hydrochlorothiazide (ZESTORETIC) 10-12.5 MG tablet START Aspirin - Take one tablet ( 81mg ) by mouth daily.   *If you need a refill on your cardiac medications before your next appointment, please call your pharmacy*   Lab Work:  Your physician recommends you have labs - BMP   If you have labs (blood work) drawn today and your tests are completely normal, you will receive your results only by: MyChart Message (if you have MyChart) OR A paper copy in the mail If you have any lab test that is abnormal or we need to change your treatment, we will call you to review the results.   Testing/Procedures:  Your physician has requested that you have an echocardiogram. Echocardiography is a painless test that uses sound waves to create images of your heart. It provides your doctor with information about the size and shape of your heart and how well your heart's chambers and valves are working. This procedure takes approximately one hour. There are no restrictions for this procedure. Please do NOT wear cologne, perfume, aftershave, or lotions (deodorant is allowed). Please arrive 15 minutes prior to your appointment time.  Please note: We ask at that you not bring children with you during ultrasound (echo/ vascular) testing. Due to room size and safety concerns, children are not allowed in the ultrasound rooms during exams. Our front office staff cannot provide observation of children in our lobby area while testing is being conducted. An adult accompanying a patient to their appointment will only be allowed in the ultrasound room at the discretion of the ultrasound technician under special circumstances. We apologize for any inconvenience.    Your cardiac CT will be scheduled at one of the below locations:   [facility] [address] [place], [place] [address] [phone]  OR   [facility] [address] [place], [place] [address] [phone]   If scheduled at [facility] or [facility], please arrive 15 mins early for check-in and test prep.  There is spacious parking and easy access to the radiology department from the [facility] entrance. Please enter here and check-in with the desk attendant.   Please follow these instructions carefully (unless otherwise directed):  An IV will be required for this test and Nitroglycerin will be given.  Hold all erectile dysfunction medications at least 3 days (72 hrs) prior to test. (Ie viagra, cialis, sildenafil, tadalafil, etc)   On the Night Before the Test: Be sure to Drink plenty of water. Do not consume any caffeinated/decaffeinated beverages or chocolate 12 hours prior to your test. Do not take any antihistamines 12 hours prior to your test.  On the Day of the Test: Drink plenty of water until 1 hour prior to the test. Do not eat any food 1 hour prior to test. You may take your regular medications prior to the test.  Take metoprolol (Lopressor) two hours prior to test. Patients who wear a continuous glucose monitor MUST remove the device prior to scanning.       After the Test: Drink plenty of water. After receiving IV contrast, you may experience a mild flushed feeling. This is normal. On occasion, you may experience a mild rash up to 24 hours after the test. This is not dangerous. If this occurs, you can take Benadryl 25 mg, Zyrtec, Claritin, or Allegra and increase your fluid intake. (Patients taking Tikosyn should avoid  Benadryl, and may take Zyrtec, Claritin, or Allegra) If you experience trouble breathing, this can be serious. If it is severe call 911 IMMEDIATELY. If it is mild, please call our office.  We will call to schedule your test 2-4 weeks out understanding that some insurance companies will need an authorization prior to the service being  performed.   For more information and frequently asked questions, please visit our website : [email or web address]  For non-scheduling related questions, please contact the cardiac imaging nurse navigator should you have any questions/concerns: Cardiac Imaging Nurse Navigators Direct Office Dial: [phone]   For scheduling needs, including cancellations and rescheduling, please call [place], [phone].   Follow-Up: At [facility], you and your health needs are our priority.  As part of our continuing mission to provide you with exceptional heart care, we have created designated Provider Care Teams.  These Care Teams include your primary Cardiologist (physician) and Advanced Practice Providers (APPs -  Physician Assistants and Nurse Practitioners) who all work together to provide you with the care you need, when you need it.  We recommend signing up for the patient portal called "MyChart".  Sign up information is provided on this After Visit Summary.  MyChart is used to connect with patients for Virtual Visits (Telemedicine).  Patients are able to view lab/test results, encounter notes, upcoming appointments, etc.  Non-urgent messages can be sent to your provider as well.   To learn more about what you can do with MyChart, go to [email or web address].    Your next appointment:   8 week(s)  Provider:   You may see Debbe Odea, MD or one of the following Advanced Practice Providers on your designated Care Team:   Nicolasa Ducking, NP Eula Listen, PA-C Cadence Fransico Michael, PA-C Charlsie Quest, NP Carlos Levering, NP

## 2023-12-20 NOTE — Progress Notes (Signed)
 Cardiology Office Note:    Date:  12/20/2023   ID:  Bradley Powers, DOB 1966-04-30, MRN [id number]  PCP:  Bradley Munroe, NP   [facility] Providers Cardiologist:  Debbe Odea, MD     Referring MD: Bradley Munroe, NP   Chief Complaint  Patient presents with   New Patient (Initial Visit)    Self referral for cardiac evaluation of chest pain and numbness.  Previous cardiac care with [facility].  Patient reports that 5 days ago he had an episode of chest pain with full body numbness.  Proceeded to [facility] for work up with negative results.     Bradley Powers is a 58 y.o. male who is being seen today for the evaluation of chest pain at the request of Bradley Munroe, NP.   History of Present Illness:    Bradley Powers is a 58 y.o. male with a hx of paroxysmal atrial fibrillation, hypertension, OSA, obesity who presents with chest pain.  Patient sings for a musical band.  5 days ago while singing, he developed sharp central chest pain lasting a few minutes.  His wife who was sitting in the crowd said patient turned very pale.  Patient endorsed having a flushed feeling in his legs and groin area.  Echo band member who is a paramedic checked his vitals and pulse and said he was okay.  EMS was called, and EKG vitals was okay.  Patient went to [facility] ED where workup with troponins was unrevealing.  He denies having any further episodes, also denies any prior episodes.  Previously seen by [facility] cardiology from a cardiac perspective.  Diagnosed with paroxysmal atrial fibrillation in 2017, hospitalized for A-fib RVR.  CHA2DS2-VASc score 1, not on anticoagulation.  Managed with Cardizem to 40 mg daily.  Was previously morbidly obese, has achieved significant weight loss with improvement in his blood pressures.  Zestoretic reduce about a year ago.  He denies any family history of heart disease, denies smoking.  Past Medical History:  Diagnosis Date   Arthritis     Atrial fibrillation (HCC)    Essential hypertension    Kidney stone    Spinal stenosis     Past Surgical History:  Procedure Laterality Date   BACK SURGERY  2000   KNEE SURGERY Right     Current Medications: Current Meds  Medication Sig   acetaminophen (TYLENOL) 325 MG tablet Take 650 mg by mouth every 6 (six) hours as needed.   diltiazem (CARDIZEM CD) 240 MG 24 hr capsule Take 1 capsule (240 mg total) by mouth daily.   metoprolol tartrate (LOPRESSOR) 100 MG tablet Take 1 tablet (100 mg total) by mouth once for 1 dose. TWO HOURS PRIOR TO CARDIAC CT   Multiple Vitamin (MULTIVITAMIN) capsule Take 1 capsule by mouth daily.   tamsulosin (FLOMAX) 0.4 MG CAPS capsule Take 1 capsule (0.4 mg total) by mouth daily.   TURMERIC PO Take 1 capsule by mouth daily.   [DISCONTINUED] lisinopril-hydrochlorothiazide (ZESTORETIC) 10-12.5 MG tablet Take 1 tablet by mouth daily.     Allergies:   Patient has no known allergies.   Social History   Socioeconomic History   Marital status: Married    Spouse name: Not on file   Number of children: Not on file   Years of education: Not on file   Highest education level: Not on file  Occupational History   Occupation: Human resources officer  Tobacco Use   Smoking status: Never  Smokeless tobacco: Never  Vaping Use   Vaping status: Never Used  Substance and Sexual Activity   Alcohol use: Yes    Alcohol/week: 0.0 standard drinks of alcohol    Comment: occ   Drug use: No   Sexual activity: Not on file  Other Topics Concern   Not on file  Social History Narrative   Married.   4 children.    Works in Airline pilot.   Enjoys DJ's, playing in a band.    Social Drivers of Corporate investment banker Strain: Not on file  Food Insecurity: Not on file  Transportation Needs: Not on file  Physical Activity: Not on file  Stress: Not on file  Social Connections: Not on file     Family History: The patient's family history includes Healthy in his  brother and sister; Heart attack in his father; Lung cancer in his mother.  ROS:   Please see the history of present illness.     All other systems reviewed and are negative.  EKGs/Labs/Other Studies Reviewed:    The following studies were reviewed today:  EKG Interpretation Date/Time:  Thursday December 20 2023 08:39:27 EST Ventricular Rate:  70 PR Interval:  184 QRS Duration:  98 QT Interval:  390 QTC Calculation: 421 R Axis:   29  Text Interpretation: Normal sinus rhythm Normal ECG Confirmed by Debbe Odea ([id number]) on 12/20/2023 8:46:53 AM    Recent Labs: 11/23/2023: ALT 12; BUN 18; Creat 0.73; Hemoglobin 13.6; Platelets 265; Potassium 3.5; Sodium 141  Recent Lipid Panel    Component Value Date/Time   CHOL 123 11/23/2023 0840   TRIG 151 (H) 11/23/2023 0840   HDL 28 (L) 11/23/2023 0840   CHOLHDL 4.4 11/23/2023 0840   LDLCALC 72 11/23/2023 0840     Risk Assessment/Calculations:             Physical Exam:    VS:  BP 100/60 (BP Location: Left Arm, Patient Position: Sitting, Cuff Size: Large)   Pulse 70   Ht 5\' 9"  (1.753 m)   Wt 260 lb (117.9 kg)   SpO2 99%   BMI 38.40 kg/m     Wt Readings from Last 3 Encounters:  12/20/23 260 lb (117.9 kg)  11/23/23 265 lb (120.2 kg)  05/15/23 272 lb (123.4 kg)     GEN:  Well nourished, well developed in no acute distress HEENT: Normal NECK: No JVD; No carotid bruits CARDIAC: RRR, no murmurs, rubs, gallops RESPIRATORY:  Clear to auscultation without rales, wheezing or rhonchi  ABDOMEN: Soft, non-tender, non-distended MUSCULOSKELETAL:  No edema; No deformity  SKIN: Warm and dry NEUROLOGIC:  Alert and oriented x 3 PSYCHIATRIC:  Normal affect   ASSESSMENT:    1. Precordial pain   2. Paroxysmal atrial fibrillation (HCC)   3. Primary hypertension    PLAN:    In order of problems listed above:  Chest pain, paleness, diaphoresis.  Obtain echocardiogram, obtain coronary CTA to evaluate CAD. Paroxysmal atrial  fibrillation, CHA2DS2-VASc of 1.  Continue Cardizem CD 240 mg daily, start aspirin 81 mg. Hypertension, BP controlled/low normal.  Stop Zestoretic, continue Cardizem CD 240 mg daily.  Follow-up after cardiac testing     Medication Adjustments/Labs and Tests Ordered: Current medicines are reviewed at length with the patient today.  Concerns regarding medicines are outlined above.  Orders Placed This Encounter  Procedures   CT CORONARY MORPH W/CTA COR W/SCORE W/CA W/CM &/OR WO/CM   Basic Metabolic Panel (BMET)   EKG 12-Lead  ECHOCARDIOGRAM COMPLETE   Meds ordered this encounter  Medications   metoprolol tartrate (LOPRESSOR) 100 MG tablet    Sig: Take 1 tablet (100 mg total) by mouth once for 1 dose. TWO HOURS PRIOR TO CARDIAC CT    Dispense:  1 tablet    Refill:  0    Patient Instructions  Medication Instructions:   STOP lisinopril-hydrochlorothiazide (ZESTORETIC) 10-12.5 MG tablet START Aspirin - Take one tablet ( 81mg ) by mouth daily.   *If you need a refill on your cardiac medications before your next appointment, please call your pharmacy*   Lab Work:  Your physician recommends you have labs - BMP   If you have labs (blood work) drawn today and your tests are completely normal, you will receive your results only by: MyChart Message (if you have MyChart) OR A paper copy in the mail If you have any lab test that is abnormal or we need to change your treatment, we will call you to review the results.   Testing/Procedures:  Your physician has requested that you have an echocardiogram. Echocardiography is a painless test that uses sound waves to create images of your heart. It provides your doctor with information about the size and shape of your heart and how well your heart's chambers and valves are working. This procedure takes approximately one hour. There are no restrictions for this procedure. Please do NOT wear cologne, perfume, aftershave, or lotions (deodorant is  allowed). Please arrive 15 minutes prior to your appointment time.  Please note: We ask at that you not bring children with you during ultrasound (echo/ vascular) testing. Due to room size and safety concerns, children are not allowed in the ultrasound rooms during exams. Our front office staff cannot provide observation of children in our lobby area while testing is being conducted. An adult accompanying a patient to their appointment will only be allowed in the ultrasound room at the discretion of the ultrasound technician under special circumstances. We apologize for any inconvenience.    Your cardiac CT will be scheduled at one of the below locations:   [facility] [address] [place], [place] [address] [phone]  OR   [facility] [address] [place], [place] [address] [phone]   If scheduled at [facility] or [facility], please arrive 15 mins early for check-in and test prep.  There is spacious parking and easy access to the radiology department from the [facility] Heart and Vascular entrance. Please enter here and check-in with the desk attendant.   Please follow these instructions carefully (unless otherwise directed):  An IV will be required for this test and Nitroglycerin will be given.  Hold all erectile dysfunction medications at least 3 days (72 hrs) prior to test. (Ie viagra, cialis, sildenafil, tadalafil, etc)   On the Night Before the Test: Be sure to Drink plenty of water. Do not consume any caffeinated/decaffeinated beverages or chocolate 12 hours prior to your test. Do not take any antihistamines 12 hours prior to your test.  On the Day of the Test: Drink plenty of water until 1 hour prior to the test. Do not eat any food 1 hour prior to test. You may take your regular medications prior to the test.  Take metoprolol (Lopressor) two  hours prior to test. Patients who wear a continuous glucose monitor MUST remove the device prior to scanning.       After the Test:  Drink plenty of water. After receiving IV contrast, you may experience a mild flushed feeling. This is normal. On occasion, you may experience a mild rash up to 24 hours after the test. This is not dangerous. If this occurs, you can take Benadryl 25 mg, Zyrtec, Claritin, or Allegra and increase your fluid intake. (Patients taking Tikosyn should avoid Benadryl, and may take Zyrtec, Claritin, or Allegra) If you experience trouble breathing, this can be serious. If it is severe call 911 IMMEDIATELY. If it is mild, please call our office.  We will call to schedule your test 2-4 weeks out understanding that some insurance companies will need an authorization prior to the service being performed.   For more information and frequently asked questions, please visit our website : [email or web address]  For non-scheduling related questions, please contact the cardiac imaging nurse navigator should you have any questions/concerns: Cardiac Imaging Nurse Navigators Direct Office Dial: [phone]   For scheduling needs, including cancellations and rescheduling, please call [place], [phone].   Follow-Up: At [facility], you and your health needs are our priority.  As part of our continuing mission to provide you with exceptional heart care, we have created designated Provider Care Teams.  These Care Teams include your primary Cardiologist (physician) and Advanced Practice Providers (APPs -  Physician Assistants and Nurse Practitioners) who all work together to provide you with the care you need, when you need it.  We recommend signing up for the patient portal called "MyChart".  Sign up information is provided on this After Visit Summary.  MyChart is used to connect with patients for Virtual Visits (Telemedicine).  Patients are able to view  lab/test results, encounter notes, upcoming appointments, etc.  Non-urgent messages can be sent to your provider as well.   To learn more about what you can do with MyChart, go to [email or web address].    Your next appointment:   8 week(s)  Provider:   You may see Debbe Odea, MD or one of the following Advanced Practice Providers on your designated Care Team:   Nicolasa Ducking, NP Eula Listen, PA-C Cadence Fransico Michael, PA-C Charlsie Quest, NP Carlos Levering, NP   Signed, Debbe Odea, MD  12/20/2023 9:35 AM    [facility]

## 2023-12-21 LAB — BASIC METABOLIC PANEL
BUN/Creatinine Ratio: 17 (ref 9–20)
BUN: 12 mg/dL (ref 6–24)
CO2: 26 mmol/L (ref 20–29)
Calcium: 8.5 mg/dL — ABNORMAL LOW (ref 8.7–10.2)
Chloride: 103 mmol/L (ref 96–106)
Creatinine, Ser: 0.72 mg/dL — ABNORMAL LOW (ref 0.76–1.27)
Glucose: 89 mg/dL (ref 70–99)
Potassium: 3.5 mmol/L (ref 3.5–5.2)
Sodium: 142 mmol/L (ref 134–144)
eGFR: 107 mL/min/{1.73_m2} (ref >59)

## 2023-12-25 ENCOUNTER — Encounter (HOSPITAL_COMMUNITY): Payer: Self-pay

## 2023-12-27 ENCOUNTER — Ambulatory Visit
Admission: RE | Admit: 2023-12-27 | Discharge: 2023-12-27 | Disposition: A | Discharge status: Home / Self Care | Payer: 59 | Source: Ambulatory Visit | Attending: Cardiology | Admitting: Cardiology

## 2023-12-27 DIAGNOSIS — R072 Precordial pain: Secondary | ICD-10-CM

## 2023-12-27 MED ORDER — METOPROLOL TARTRATE 5 MG/5ML IV SOLN
10.0000 mg | Freq: Once | INTRAVENOUS | Status: AC | PRN
  Administered 2023-12-27: 10 mg via INTRAVENOUS

## 2023-12-27 MED ORDER — NITROGLYCERIN 0.4 MG SL SUBL
0.8000 mg | SUBLINGUAL_TABLET | Freq: Once | SUBLINGUAL | Status: AC
  Administered 2023-12-27: 0.8 mg via SUBLINGUAL

## 2023-12-27 MED ORDER — SODIUM CHLORIDE 0.9 % IV SOLN: INTRAVENOUS | Status: DC

## 2023-12-27 MED ORDER — DILTIAZEM HCL 25 MG/5ML IV SOLN: 10.0000 mg | INTRAVENOUS | Status: DC | PRN

## 2023-12-27 MED ORDER — IOHEXOL 350 MG/ML SOLN
100.0000 mL | Freq: Once | INTRAVENOUS | Status: AC | PRN
  Administered 2023-12-27: 100 mL via INTRAVENOUS

## 2023-12-27 NOTE — Progress Notes (Signed)
 Patient tolerated procedure well. Ambulate w/o difficulty. Denies light headedness or being dizzy. Sitting up drinking water provided. Encouraged to drink extra water today and reasoning explained. Verbalized understanding. All questions answered. ABC intact. No further needs. Discharge from procedure area w/o issues.

## 2024-01-15 ENCOUNTER — Ambulatory Visit: Payer: 59 | Attending: Cardiology

## 2024-01-15 DIAGNOSIS — R072 Precordial pain: Secondary | ICD-10-CM | POA: Diagnosis not present

## 2024-01-15 LAB — ECHOCARDIOGRAM COMPLETE
AV Mean grad: 6 mmHg
AV Peak grad: 11.4 mmHg
Ao pk vel: 1.69 m/s
Area-P 1/2: 3.53 cm2
S' Lateral: 3.5 cm

## 2024-01-25 ENCOUNTER — Encounter: Payer: Self-pay | Admitting: Internal Medicine

## 2024-01-25 MED ORDER — PHENTERMINE HCL 30 MG PO CAPS: 30.0000 mg | ORAL_CAPSULE | ORAL | 0 refills | Status: DC

## 2024-01-29 NOTE — Progress Notes (Unsigned)
 Cardiology Office Note    Date:  01/31/2024   ID:  Bradley Powers, DOB 09-19-1966, MRN [id number]  PCP:  Lorre Munroe, NP  Cardiologist:  Debbe Odea, MD  Electrophysiologist:  None   Chief Complaint: Follow-up  History of Present Illness:   Bradley Powers is a 58 y.o. male with history of paroxysmal atrial fibrillation, hypertension, OSA, and obesity who is being seen for follow up on precordial chest pain.   Patient previously followed by [facility] cardiology. Hospitalized 09/2016 for newly diagnosed atrial fibrillation with RVR. This was thought to be driven by untreated sleep apnea and uncontrolled hypertension. Echo with normal EF at that time. He was started on Eliquis and Cardizem CD. CHA2DS2-VASc score 1, decision was made at outpatient follow up with [facility] cardiology to discontinue Eliquis. He has no known reoccurrence of atrial fibrillation since. Seen in [facility] ED 12/16/2023 for sudden onset heart pounding, near syncope, generalized weakness/numbness, and chest discomfort that started while he was staying with his band. Bystanders noted that he looked very pale at that time.  Patient also noted that he felt numbness in his legs during this event. Workup in the ED was reassuring including EKG, troponin, D-dimer, and CTA chest abdomen and pelvis. Bedside ultrasound without finding of pericardial effusion or decreased EF. Monitored on telemetry without arrhythmia noted. He was seen in follow-up and establish care with Dr. Azucena Cecil on 12/20/2023. At that time he was doing well from a cardiac perspective without further episodes. Coronary CTA 12/27/2023 showed a calcium score of 0 with no evidence of CAD. Radiology reading of CTA notes aorta to be normal in size. Echo 01/15/2024 showed EF 55 to 60% with no RWMA, grade 2 diastolic dysfunction, normal RV systolic function and size, mild MR, mild dilation of the ascending aorta at 42 mm, RA pressure 3 mmHg.  Today he reports doing well  from a cardiac perspective without symptoms of angina or cardiac decompensation. He denies any episodes similar to that of last month. He denies chest pain, shortness of breath, palpitations, lightheadedness, dizziness, bleeding, and lower extremity swelling. He takes his blood pressure at home with readings 120s/70s.   Labs independently reviewed: 12/20/2023- Cr 0.72, BUN 17, K 3.5 11/23/2023- TC, 123, LDL 72, HDL 28, TG 151  Past Medical History:  Diagnosis Date   Acquired dilation of ascending aorta and aortic root (HCC)    Arthritis    Chest pain    a. 12/2023 Cor CTA: Ca2+ = 0.  Nl cors.   Diastolic dysfunction    a. 01/2024 Echo: EF 55-60%, no rmwa, GrII DD, nl RV fxn, RVSP 23.41mmHg. Mild MR. Asc Ao 42mm.   Essential hypertension    Kidney stone    PAF (paroxysmal atrial fibrillation) (HCC)    a. Dx 2017--CHA2DS2VASc = 1 - no OAC.   Spinal stenosis     Past Surgical History:  Procedure Laterality Date   BACK SURGERY  2000   KNEE SURGERY Right     Current Medications: Current Meds  Medication Sig   acetaminophen (TYLENOL) 325 MG tablet Take 650 mg by mouth every 6 (six) hours as needed.   aspirin EC 81 MG tablet Take 81 mg by mouth daily. Swallow whole.   diltiazem (CARDIZEM CD) 240 MG 24 hr capsule Take 1 capsule (240 mg total) by mouth daily.   Multiple Vitamin (MULTIVITAMIN) capsule Take 1 capsule by mouth daily.   phentermine 30 MG capsule Take 1 capsule (30 mg total)  by mouth every morning.   tamsulosin (FLOMAX) 0.4 MG CAPS capsule Take 1 capsule (0.4 mg total) by mouth daily.   TURMERIC PO Take 1 capsule by mouth daily.    Allergies:   Patient has no known allergies.   Social History   Socioeconomic History   Marital status: Married    Spouse name: Not on file   Number of children: Not on file   Years of education: Not on file   Highest education level: Not on file  Occupational History   Occupation: Human resources officer  Tobacco Use   Smoking status: Never    Smokeless tobacco: Never  Vaping Use   Vaping status: Never Used  Substance and Sexual Activity   Alcohol use: Yes    Alcohol/week: 0.0 standard drinks of alcohol    Comment: occ   Drug use: No   Sexual activity: Not on file  Other Topics Concern   Not on file  Social History Narrative   Married.   4 children.    Works in Airline pilot.   Enjoys DJ's, playing in a band.    Social Drivers of Corporate investment banker Strain: Not on file  Food Insecurity: Not on file  Transportation Needs: Not on file  Physical Activity: Not on file  Stress: Not on file  Social Connections: Not on file     Family History:  The patient's family history includes Healthy in his brother and sister; Heart attack in his father; Lung cancer in his mother.  ROS:   12-point review of systems is negative unless otherwise noted in the HPI.  EKGs/Labs/Other Studies Reviewed:    Studies reviewed were summarized above. The additional studies were reviewed today:  01/15/2024 Echo complete 1. Left ventricular ejection fraction, by estimation, is 55 to 60%. Left  ventricular ejection fraction by PLAX is 57 %. The left ventricle has  normal function. The left ventricle has no regional wall motion  abnormalities. Left ventricular diastolic  parameters are consistent with Grade II diastolic dysfunction  (pseudonormalization). The average left ventricular global longitudinal  strain is -19.9 %. The global longitudinal strain is normal.   2. Right ventricular systolic function is normal. The right ventricular  size is normal. There is normal pulmonary artery systolic pressure. The  estimated right ventricular systolic pressure is 23.1 mmHg.   3. Left atrial size was mildly dilated.   4. The mitral valve is normal in structure. Mild mitral valve  regurgitation. No evidence of mitral stenosis.   5. The aortic valve is normal in structure. Aortic valve regurgitation is  not visualized. No aortic stenosis is  present.   6. There is mild dilatation of the ascending aorta, measuring 42 mm.   7. The inferior vena cava is normal in size with greater than 50%  respiratory variability, suggesting right atrial pressure of 3 mmHg.   12/27/2023 Coronary CTA 1. Coronary calcium score of 0. 2. Normal coronary origin with right dominance. 3. No evidence of CAD. 4. CAD-RADS 0. Consider non-atherosclerotic causes of chest pain.  Recent Labs: 11/23/2023: ALT 12; Hemoglobin 13.6; Platelets 265 12/20/2023: BUN 12; Creatinine, Ser 0.72; Potassium 3.5; Sodium 142  Recent Lipid Panel    Component Value Date/Time   CHOL 123 11/23/2023 0840   TRIG 151 (H) 11/23/2023 0840   HDL 28 (L) 11/23/2023 0840   CHOLHDL 4.4 11/23/2023 0840   LDLCALC 72 11/23/2023 0840    PHYSICAL EXAM:    VS:  BP 126/72   Pulse  67   Ht 5\' 9"  (1.753 m)   Wt 255 lb 12.8 oz (116 kg)   SpO2 98%   BMI 37.78 kg/m   BMI: Body mass index is 37.78 kg/m.  Physical Exam Constitutional:      Appearance: Normal appearance.  Cardiovascular:     Rate and Rhythm: Normal rate and regular rhythm.     Pulses: Normal pulses.     Heart sounds: Normal heart sounds. No murmur heard. Pulmonary:     Effort: Pulmonary effort is normal.     Breath sounds: Normal breath sounds.  Musculoskeletal:     Right lower leg: No edema.     Left lower leg: No edema.  Skin:    General: Skin is warm and dry.  Neurological:     Mental Status: He is alert and oriented to person, place, and time.  Psychiatric:        Mood and Affect: Mood normal.        Behavior: Behavior normal.    Wt Readings from Last 3 Encounters:  01/31/24 255 lb 12.8 oz (116 kg)  12/20/23 260 lb (117.9 kg)  11/23/23 265 lb (120.2 kg)     ASSESSMENT & PLAN:   Precordial pain - One episode of atypical chest pain with associated heart racing, weakness/numbness, and paleness in 12/2023. ED workup including EKG, troponin, D-dimer, and CT abdomen, chest, and pelvis unremarkable. No  similar episodes since. Coronary CTA 12/27/2023 with calcium score 0. Echo 01/15/2024 with EF 55-60%, G2DD, mild mitral regurgitation. Denies symptoms of angina or cardiac decompensation. Overall workup reassuring without recurrent episodes. No further workup indicated at this time.   Paroxysmal atrial fibrillation - No know recurrence of atrial fibrillation since diagnosis in 2017. Rate and rhythm normal on my exam today. CHA2DS2VASc 1, shared decision for no anticoagulation at this time. Continue ASA and Cardizem CD 240 mg.   Possible ascending aorta dilation - Echo 01/15/2024 with mild dilation of the ascending aorta measured at 42 mm. However, CTA 12/27/2023 with radiology comment of normal aorta. Recommend surveillance with repeat imaging in one year  Hypertension - BP well controlled per office reading and reported at home readings. Continue Cardizem as above.   Disposition: F/u with Dr. Azucena Cecil or an APP in 6 months.   Medication Adjustments/Labs and Tests Ordered: Current medicines are reviewed at length with the patient today.  Concerns regarding medicines are outlined above. Medication changes, Labs and Tests ordered today are summarized above and listed in the Patient Instructions accessible in Encounters.   Velora Mediate, PA-C 01/31/2024 12:44 PM     [facility] [place] [address] [place], [place] [address] [phone]

## 2024-01-31 ENCOUNTER — Encounter: Payer: Self-pay | Admitting: Nurse Practitioner

## 2024-01-31 ENCOUNTER — Ambulatory Visit: Payer: 59 | Attending: Nurse Practitioner | Admitting: Physician Assistant

## 2024-01-31 VITALS — BP 126/72 | HR 67 | Ht 69.0 in | Wt 255.8 lb

## 2024-01-31 DIAGNOSIS — Q254 Congenital malformation of aorta unspecified: Secondary | ICD-10-CM

## 2024-01-31 DIAGNOSIS — I480 Paroxysmal atrial fibrillation: Secondary | ICD-10-CM

## 2024-01-31 DIAGNOSIS — R072 Precordial pain: Secondary | ICD-10-CM | POA: Diagnosis not present

## 2024-01-31 DIAGNOSIS — I10 Essential (primary) hypertension: Secondary | ICD-10-CM

## 2024-01-31 NOTE — Patient Instructions (Signed)
 Medication Instructions:  No changes *If you need a refill on your cardiac medications before your next appointment, please call your pharmacy*   Lab Work: None ordered If you have labs (blood work) drawn today and your tests are completely normal, you will receive your results only by: MyChart Message (if you have MyChart) OR A paper copy in the mail If you have any lab test that is abnormal or we need to change your treatment, we will call you to review the results.   Testing/Procedures: None ordered   Follow-Up: At [facility], you and your health needs are our priority.  As part of our continuing mission to provide you with exceptional heart care, we have created designated Provider Care Teams.  These Care Teams include your primary Cardiologist (physician) and Advanced Practice Providers (APPs -  Physician Assistants and Nurse Practitioners) who all work together to provide you with the care you need, when you need it.  We recommend signing up for the patient portal called "MyChart".  Sign up information is provided on this After Visit Summary.  MyChart is used to connect with patients for Virtual Visits (Telemedicine).  Patients are able to view lab/test results, encounter notes, upcoming appointments, etc.  Non-urgent messages can be sent to your provider as well.   To learn more about what you can do with MyChart, go to [email or web address].    Your next appointment:   6 month(s)  Provider:   Debbe Odea, MD

## 2024-03-01 ENCOUNTER — Other Ambulatory Visit: Payer: Self-pay | Admitting: Internal Medicine

## 2024-03-03 MED ORDER — PHENTERMINE HCL 30 MG PO CAPS: 30.0000 mg | ORAL_CAPSULE | ORAL | 0 refills | Status: DC

## 2024-03-26 ENCOUNTER — Other Ambulatory Visit: Payer: Self-pay | Admitting: Internal Medicine

## 2024-03-26 MED ORDER — PHENTERMINE HCL 30 MG PO CAPS
30.0000 mg | ORAL_CAPSULE | ORAL | 0 refills | Status: DC
Start: 2024-03-26 — End: 2024-04-24

## 2024-03-27 ENCOUNTER — Other Ambulatory Visit: Payer: Self-pay

## 2024-03-27 MED ORDER — DILTIAZEM HCL ER COATED BEADS 240 MG PO CP24: 240.0000 mg | ORAL_CAPSULE | Freq: Every day | ORAL | 3 refills | Status: DC

## 2024-04-18 ENCOUNTER — Other Ambulatory Visit: Payer: Self-pay | Admitting: Internal Medicine

## 2024-04-18 MED ORDER — CIPROFLOXACIN HCL 500 MG PO TABS
500.0000 mg | ORAL_TABLET | Freq: Two times a day (BID) | ORAL | 0 refills | Status: AC
Start: 2024-04-18 — End: 2024-04-25

## 2024-04-24 ENCOUNTER — Other Ambulatory Visit: Payer: Self-pay | Admitting: Internal Medicine

## 2024-04-24 MED ORDER — TAMSULOSIN HCL 0.4 MG PO CAPS: 0.4000 mg | ORAL_CAPSULE | Freq: Every day | ORAL | 1 refills | Status: DC

## 2024-04-24 MED ORDER — PHENTERMINE HCL 30 MG PO CAPS: 30.0000 mg | ORAL_CAPSULE | ORAL | 0 refills | Status: DC

## 2024-05-22 ENCOUNTER — Encounter: Payer: Self-pay | Admitting: Internal Medicine

## 2024-05-22 ENCOUNTER — Ambulatory Visit (INDEPENDENT_AMBULATORY_CARE_PROVIDER_SITE_OTHER): Payer: Self-pay | Admitting: Internal Medicine

## 2024-05-22 VITALS — BP 128/70 | Ht 69.0 in | Wt 264.2 lb

## 2024-05-22 DIAGNOSIS — R053 Chronic cough: Secondary | ICD-10-CM | POA: Diagnosis not present

## 2024-05-22 DIAGNOSIS — I480 Paroxysmal atrial fibrillation: Secondary | ICD-10-CM | POA: Diagnosis not present

## 2024-05-22 DIAGNOSIS — F19982 Other psychoactive substance use, unspecified with psychoactive substance-induced sleep disorder: Secondary | ICD-10-CM

## 2024-05-22 DIAGNOSIS — L4050 Arthropathic psoriasis, unspecified: Secondary | ICD-10-CM | POA: Diagnosis not present

## 2024-05-22 DIAGNOSIS — I10 Essential (primary) hypertension: Secondary | ICD-10-CM | POA: Diagnosis not present

## 2024-05-22 DIAGNOSIS — B001 Herpesviral vesicular dermatitis: Secondary | ICD-10-CM

## 2024-05-22 DIAGNOSIS — Z87898 Personal history of other specified conditions: Secondary | ICD-10-CM

## 2024-05-22 DIAGNOSIS — M059 Rheumatoid arthritis with rheumatoid factor, unspecified: Secondary | ICD-10-CM | POA: Diagnosis not present

## 2024-05-22 DIAGNOSIS — L409 Psoriasis, unspecified: Secondary | ICD-10-CM

## 2024-05-22 DIAGNOSIS — Z87442 Personal history of urinary calculi: Secondary | ICD-10-CM | POA: Insufficient documentation

## 2024-05-22 DIAGNOSIS — E785 Hyperlipidemia, unspecified: Secondary | ICD-10-CM | POA: Insufficient documentation

## 2024-05-22 DIAGNOSIS — E781 Pure hyperglyceridemia: Secondary | ICD-10-CM | POA: Diagnosis not present

## 2024-05-22 DIAGNOSIS — G4700 Insomnia, unspecified: Secondary | ICD-10-CM | POA: Insufficient documentation

## 2024-05-22 DIAGNOSIS — M159 Polyosteoarthritis, unspecified: Secondary | ICD-10-CM | POA: Diagnosis not present

## 2024-05-22 MED ORDER — PHENTERMINE HCL 30 MG PO CAPS: 30.0000 mg | ORAL_CAPSULE | ORAL | 0 refills | Status: DC

## 2024-05-22 MED ORDER — VALACYCLOVIR HCL 1 G PO TABS: 1000.0000 mg | ORAL_TABLET | Freq: Two times a day (BID) | ORAL | 0 refills | Status: AC

## 2024-05-22 NOTE — Patient Instructions (Signed)
 Cold Sore    A cold sore, also called a fever blister, is a small, fluid-filled sore that forms inside of the mouth or on the lips, gums, nose, chin, or cheeks. Cold sores can spread to other parts of the body, such as the eyes, fingers, or genitals.  Cold sores can spread from person to person (are contagious) until the sores crust over completely. Most cold sores go away within 2 weeks.  What are the causes?  Cold sores are caused by a virus (herpes simplex virus type 1, HSV-1). The virus can spread from person to person through close contact, such as through:  Kissing.  Touching the affected area.  Sharing personal items such as lip balm, razors, a drinking glass, or eating utensils.  What increases the risk?  Being tired, stressed, or sick.  Having your period (menstruating).  Being pregnant.  Taking certain medicines.  Being out in cold weather or getting too much sun.  What are the signs or symptoms?  Symptoms of a cold sore go through different stages:  Tingling, itching, or burning is felt 1-2 days before the cold sore appears.  Fluid-filled blisters appear on the lips, inside the mouth, on the nose, or on the cheeks.  The blisters start to ooze clear fluid.  The blisters dry up, and a yellow crust appears in their place.  The crust falls off.  In some cases, other symptoms can develop along with cold sores. These can include:  Fever.  Sore throat.  Headache.  Muscle aches.  Swollen neck glands.  How is this treated?  There is no cure for cold sores or the virus that causes them. There is also no vaccine to prevent the virus. Most cold sores go away on their own without treatment within 2 weeks. Your doctor may prescribe medicines to:  Help with pain.  Keep the virus from growing.  Help you heal faster.  Medicines may be in the form of creams, gels, pills, or a shot.  Follow these instructions at home:  Medicines  Take or apply over-the-counter and prescription medicines only as told by your doctor.  Use a  cotton-tip swab to apply creams or gels to your sores.  Ask your doctor if you can take lysine supplements. These may help with healing.  Sore care    Do not touch the sores or pick the scabs.  Wash your hands often with soap and water for at least 20 seconds. Do not touch your eyes without washing your hands first.  Keep the sores clean and dry.  If told, put ice on the sores. To do this:  Put ice in a plastic bag.  Place a towel between your skin and the bag.  Leave the ice on for 20 minutes, 2-3 times a day.  Take off the ice if your skin turns bright red. This is very important. If you cannot feel pain, heat, or cold, you have a greater risk of damage to the area.  Eating and drinking  Eat a soft, bland diet. Avoid eating hot, cold, or salty foods. These can hurt your mouth.  Use a straw if it hurts to drink out of a glass.  Eat foods that have a lot of lysine in them. These include meat, fish, and dairy products.  Avoid sugary foods, chocolates, nuts, and grains. These foods have a high amount of a substance (arginine) that can cause the virus to grow.  Lifestyle  Do not kiss, have oral sex,  or share personal items until your sores heal.  Stress, poor sleep, and being out in the sun can trigger a cold sore. Make sure you:  Do activities that help you relax, such as deep breathing exercises or meditation.  Get enough sleep.  Put sunscreen on your lips before you go out in the sun.  Contact a doctor if:  You have symptoms for more than 2 weeks.  You have pus coming from the sores.  You have redness that is spreading.  You have pain or irritation in your eye.  You get sores on your genitals.  Your sores do not heal within 2 weeks.  You get cold sores often.  Get help right away if:  You have a fever and your symptoms suddenly get worse.  You have a headache and confusion.  You have tiredness (fatigue).  You do not want to eat as much as normal (loss of appetite).  You have a stiff neck or are sensitive to  light.  Summary  A cold sore is a small, fluid-filled sore that forms inside of the mouth or on the lips, gums, nose, chin, or cheeks.  Cold sores can spread from person to person (are contagious) until the sores crust over completely. Most cold sores go away within 2 weeks.  Wash your hands often. Do not touch your eyes without washing your hands first.  Do not kiss, have oral sex, or share personal items until your sores heal.  Contact a doctor if your sores do not heal within 2 weeks.  This information is not intended to replace advice given to you by your health care provider. Make sure you discuss any questions you have with your health care provider.  Document Revised: 08/03/2021 Document Reviewed: 08/03/2021  Elsevier Patient Education  2024 [employer].

## 2024-05-22 NOTE — Assessment & Plan Note (Signed)
 Continue taking tylenol  and ibuprofen OTC as needed He is not following with rheumatology

## 2024-05-22 NOTE — Assessment & Plan Note (Signed)
 Okay to continue ibuprofen or tylenol  OTC Not following with orthopedics at this time

## 2024-05-22 NOTE — Assessment & Plan Note (Signed)
He is not interested in medication management at this time We will monitor

## 2024-05-22 NOTE — Assessment & Plan Note (Signed)
 Continue tamsulosin  0.4 mg caps daily

## 2024-05-22 NOTE — Assessment & Plan Note (Signed)
 Encourage diet and exercise for weight loss Continue phentermine  30 mg daily and topamax  25 mg twice daily

## 2024-05-22 NOTE — Assessment & Plan Note (Signed)
 Continue diltiazem  240 mg and aspirin  81 mg daily He will continue to follow with cardiology

## 2024-05-22 NOTE — Assessment & Plan Note (Signed)
 He declines checking lipid profile today Encouraged him to consume a low fat diet

## 2024-05-22 NOTE — Assessment & Plan Note (Signed)
 Currently not an issue We will monitor

## 2024-05-22 NOTE — Progress Notes (Signed)
 HPI  Patient presents to clinic today for follow-up of chronic conditions.  HTN: His BP today is 128/70.  He is taking diltiazem  and metoprolol  as prescribed.  ECG from 12/2023 reviewed.  Psoriatic/rheumatic and osteoarthritis: He takes tylenol  and ibuprofen as needed with good relief of symptoms.  He does not follow with rheumatology.  A-fib: Managed with diltiazem  and aspirin .  ECG from 12/2023 reviewed.  He follows with cardiology.  History of prediabetes: His last A1c was 5.3%, 11/2023.  He is not taking any oral diabetic medication this time.  He does not check her sugars.  History of kidney stones:  He is taking tamsulosin  as prescribed.  He does not follow with urology.  Psoriasis: He reports it was on his elbows, knees but this has been better lately. He is not currently using any creams or lotions for this.  He does not follow with dermatology.  HLD: His last LDL was 72, triglycerides 848, 11/2023.  He is not taking any cholesterol lowering medication at this time.  He tries to consume a low-fat diet.  Obesity: Managed with phentermine  and topamax . His starting weight was 314.8 down to 264.2 lbs. his BMI today is 39.02.  Insomnia: He is having difficulty falling asleep.  He noticed this since starting the phentermine .  He is not taking any medication for this. He has never had a sleep study.  He also reports a chronic cough.  He noticed this a few months ago.  The cough is dry nonproductive.  He denies runny nose, nasal congestion, ear pain or shortness of breath.  He takes a daily antihistamine but this has not seemed to help.  He denies having reflux.  He also reports a rash of his lower lip.  He noticed this 2 weeks ago after sun exposure [place] the beach.  The rash does not itch, burn or tingle.  He has tried Vaseline OTC with minimal relief of symptoms.  Past Medical History:  Diagnosis Date   Acquired dilation of ascending aorta and aortic root (HCC)    Arthritis    Chest  pain    a. 12/2023 Cor CTA: Ca2+ = 0.  Nl cors.   Diastolic dysfunction    a. 01/2024 Echo: EF 55-60%, no rmwa, GrII DD, nl RV fxn, RVSP 23.24mmHg. Mild MR. Asc Ao 42mm.   Essential hypertension    Kidney stone    PAF (paroxysmal atrial fibrillation) (HCC)    a. Dx 2017--CHA2DS2VASc = 1 - no OAC.   Precordial pain    a. 12/2023 One episode of atypical chest pain with associated heart racing, weakness/numbness, and paleness. ED workup including EKG, troponin, D-dimer, and CT abdomen, chest, and pelvis unremarkable.   Spinal stenosis     Current Outpatient Medications  Medication Sig Dispense Refill   acetaminophen  (TYLENOL ) 325 MG tablet Take 650 mg by mouth every 6 (six) hours as needed.     aspirin  EC 81 MG tablet Take 81 mg by mouth daily. Swallow whole.     diltiazem  (CARDIZEM  CD) 240 MG 24 hr capsule Take 1 capsule (240 mg total) by mouth daily. 30 capsule 3   metoprolol  tartrate (LOPRESSOR ) 100 MG tablet Take 1 tablet (100 mg total) by mouth once for 1 dose. TWO HOURS PRIOR TO CARDIAC CT 1 tablet 0   Multiple Vitamin (MULTIVITAMIN) capsule Take 1 capsule by mouth daily.     phentermine  30 MG capsule Take 1 capsule (30 mg total) by mouth every morning. 30 capsule 0  tamsulosin  (FLOMAX ) 0.4 MG CAPS capsule Take 1 capsule (0.4 mg total) by mouth daily. 90 capsule 1   TURMERIC PO Take 1 capsule by mouth daily.     No current facility-administered medications for this visit.    No Known Allergies  Family History  Problem Relation Age of Onset   Lung cancer Mother    Heart attack Father    Healthy Sister    Healthy Brother     Social History   Socioeconomic History   Marital status: Married    Spouse name: Not on file   Number of children: Not on file   Years of education: Not on file   Highest education level: GED or equivalent  Occupational History   Occupation: Human resources officer  Tobacco Use   Smoking status: Never   Smokeless tobacco: Never  Vaping Use   Vaping  status: Never Used  Substance and Sexual Activity   Alcohol use: Yes    Alcohol/week: 0.0 standard drinks of alcohol    Comment: occ   Drug use: No   Sexual activity: Not on file  Other Topics Concern   Not on file  Social History Narrative   Married.   4 children.    Works in Airline pilot.   Enjoys DJ's, playing in a band.    Social Drivers of Corporate investment banker Strain: Low Risk  (05/21/2024)   Overall Financial Resource Strain (CARDIA)    Difficulty of Paying Living Expenses: Not hard at all  Food Insecurity: No Food Insecurity (05/21/2024)   Hunger Vital Sign    Worried About Running Out of Food in the Last Year: Never true    Ran Out of Food in the Last Year: Never true  Transportation Needs: No Transportation Needs (05/21/2024)   PRAPARE - Administrator, Civil Service (Medical): No    Lack of Transportation (Non-Medical): No  Physical Activity: Unknown (05/21/2024)   Exercise Vital Sign    Days of Exercise per Week: Patient declined    Minutes of Exercise per Session: Not on file  Stress: Stress Concern Present (05/21/2024)   [employer] of Occupational Health - Occupational Stress Questionnaire    Feeling of Stress: To some extent  Social Connections: Moderately Isolated (05/21/2024)   Social Connection and Isolation Panel    Frequency of Communication with Friends and Family: More than three times a week    Frequency of Social Gatherings with Friends and Family: Once a week    Attends Religious Services: Patient declined    Database administrator or Organizations: No    Attends Engineer, structural: Not on file    Marital Status: Married  Catering manager Violence: Not on file    ROS:  Constitutional: Denies fever, malaise, fatigue, headache or abrupt weight changes.  HEENT: Denies eye pain, eye redness, ear pain, ringing in the ears, wax buildup, runny nose, nasal congestion, bloody nose, or sore throat. Respiratory: Patient reports  chronic cough.  Denies difficulty breathing, shortness of breath, or sputum production.   Cardiovascular: Denies chest pain, chest tightness, palpitations or swelling in the hands or feet.  Gastrointestinal: Denies abdominal pain, bloating, constipation, diarrhea or blood in the stool.  GU: Denies frequency, urgency, pain with urination, blood in urine, odor or discharge. Musculoskeletal: Patient reports intermittent joint pain.  Denies decrease in range of motion, difficulty with gait, muscle pain or joint swelling.  Skin: Patient reports rash of lower lip.  Denies redness or ulcercations.  Neurological: Patient reports insomnia.  Denies dizziness, difficulty with memory, difficulty with speech or problems with balance and coordination.  Psych: Denies anxiety, depression, SI/HI.  No other specific complaints in a complete review of systems (except as listed in HPI above).  PE:  BP 128/70 (BP Location: Right Arm, Patient Position: Sitting, Cuff Size: Normal)   Ht 5' 9 (1.753 m)   Wt 264 lb 3.2 oz (119.8 kg)   BMI 39.02 kg/m    Wt Readings from Last 3 Encounters:  01/31/24 255 lb 12.8 oz (116 kg)  12/20/23 260 lb (117.9 kg)  11/23/23 265 lb (120.2 kg)    General: Appears his stated age, obese, in NAD. Skin: Scabbed lesions noted of bilateral lower lip. HEENT: Head: normal shape and size; Eyes: sclera white, no icterus, conjunctiva pink, PERRLA and EOMs intact; Throat: mucosa pink and moist, no evidence of PND. Cardiovascular: Normal rate and rhythm. S1,S2 noted.  No murmur, rubs or gallops noted. No JVD or BLE edema. No carotid bruits noted. Pulmonary/Chest: Normal effort and positive vesicular breath sounds. No respiratory distress. No wheezes, rales or ronchi noted.  Musculoskeletal: Joint deformity noted in hands. No difficulty with gait.  Neurological: Alert and oriented. Cranial nerves II-XII grossly intact. Coordination normal.  Psychiatric: Mood and affect normal. Behavior is  normal. Judgment and thought content normal.     BMET    Component Value Date/Time   NA 142 12/20/2023 0928   K 3.5 12/20/2023 0928   CL 103 12/20/2023 0928   CO2 26 12/20/2023 0928   GLUCOSE 89 12/20/2023 0928   GLUCOSE 99 11/23/2023 0840   BUN 12 12/20/2023 0928   CREATININE 0.72 (L) 12/20/2023 0928   CREATININE 0.73 11/23/2023 0840   CALCIUM 8.5 (L) 12/20/2023 0928   GFRNONAA >60 09/24/2016 0551   GFRAA >60 09/24/2016 0551    Lipid Panel     Component Value Date/Time   CHOL 123 11/23/2023 0840   TRIG 151 (H) 11/23/2023 0840   HDL 28 (L) 11/23/2023 0840   CHOLHDL 4.4 11/23/2023 0840   LDLCALC 72 11/23/2023 0840    CBC    Component Value Date/Time   WBC 13.7 (H) 11/23/2023 0840   RBC 4.66 11/23/2023 0840   HGB 13.6 11/23/2023 0840   HCT 39.0 11/23/2023 0840   PLT 265 11/23/2023 0840   MCV 83.7 11/23/2023 0840   MCH 29.2 11/23/2023 0840   MCHC 34.9 11/23/2023 0840   RDW 12.9 11/23/2023 0840    Hgb A1C Lab Results  Component Value Date   HGBA1C 5.3 11/23/2023     Assessment and Plan:  Chronic cough:  Discussed common causes of chronic cough including medication induced, postnasal drip, silent reflux, asthma or nervous tics No evidence of PND on exam however he has been taking the same antihistamine for months, advised him to change to a different antihistamine OTC Advised him that if no improvement, he could try prilosec OTC for 2 weeks to see if this is silent reflux Consider referral to ENT for further evaluation  Cold sore following sun exposure:  Rx for valtrex  1000 mg twice daily x 7 to 10 days Be sure to use lip moisturizer with SPF prior to sun exposure  RTC in 6 months for your annual exam Angeline Laura, NP

## 2024-05-22 NOTE — Assessment & Plan Note (Signed)
 He declines to check A1c today Encourage low-carb diet and exercise for weight loss

## 2024-05-22 NOTE — Assessment & Plan Note (Signed)
 Controlled on diltiazem  240 mg daily Reinforced DASH diet and exercise for weight loss

## 2024-06-26 ENCOUNTER — Other Ambulatory Visit: Payer: Self-pay | Admitting: Internal Medicine

## 2024-06-26 MED ORDER — TOPIRAMATE 25 MG PO TABS: 25.0000 mg | ORAL_TABLET | Freq: Two times a day (BID) | ORAL | 0 refills | Status: DC

## 2024-06-26 MED ORDER — PHENTERMINE HCL 30 MG PO CAPS: 30.0000 mg | ORAL_CAPSULE | ORAL | 0 refills | Status: DC

## 2024-07-09 ENCOUNTER — Other Ambulatory Visit: Payer: Self-pay | Admitting: Internal Medicine

## 2024-07-09 MED ORDER — CLOBETASOL PROPIONATE 0.05 % EX OINT: 1.0000 | TOPICAL_OINTMENT | Freq: Two times a day (BID) | CUTANEOUS | 2 refills | Status: AC

## 2024-07-09 MED ORDER — CLOBETASOL PROPIONATE 0.05 % EX CREA: 1.0000 | TOPICAL_CREAM | Freq: Two times a day (BID) | CUTANEOUS | 2 refills | Status: DC

## 2024-07-23 ENCOUNTER — Other Ambulatory Visit (HOSPITAL_COMMUNITY): Payer: Self-pay

## 2024-07-23 ENCOUNTER — Other Ambulatory Visit: Payer: Self-pay | Admitting: Cardiology

## 2024-07-23 MED ORDER — DILTIAZEM HCL ER COATED BEADS 240 MG PO CP24
240.0000 mg | ORAL_CAPSULE | Freq: Every day | ORAL | 1 refills | Status: AC
  Filled 2024-07-23: qty 30, 30d supply, fill #0

## 2024-07-24 ENCOUNTER — Other Ambulatory Visit: Payer: Self-pay | Admitting: Internal Medicine

## 2024-07-24 ENCOUNTER — Other Ambulatory Visit (HOSPITAL_COMMUNITY): Payer: Self-pay

## 2024-07-24 MED ORDER — PHENTERMINE HCL 30 MG PO CAPS: 30.0000 mg | ORAL_CAPSULE | ORAL | 0 refills | Status: DC

## 2024-09-23 ENCOUNTER — Other Ambulatory Visit: Payer: Self-pay | Admitting: Internal Medicine

## 2024-09-23 MED ORDER — PHENTERMINE HCL 30 MG PO CAPS: 30.0000 mg | ORAL_CAPSULE | ORAL | 0 refills | Status: DC

## 2024-10-20 ENCOUNTER — Other Ambulatory Visit: Payer: Self-pay

## 2024-10-20 MED ORDER — TOPIRAMATE 25 MG PO TABS: 25.0000 mg | ORAL_TABLET | Freq: Two times a day (BID) | ORAL | 0 refills | Status: DC

## 2024-10-20 MED ORDER — TAMSULOSIN HCL 0.4 MG PO CAPS: 0.4000 mg | ORAL_CAPSULE | Freq: Every day | ORAL | 1 refills | Status: AC

## 2024-10-20 MED ORDER — PHENTERMINE HCL 30 MG PO CAPS: 30.0000 mg | ORAL_CAPSULE | ORAL | 0 refills | Status: DC

## 2024-10-22 ENCOUNTER — Encounter: Payer: Self-pay | Admitting: Cardiology

## 2024-10-31 ENCOUNTER — Telehealth: Payer: Self-pay | Admitting: Emergency Medicine

## 2024-10-31 DIAGNOSIS — I480 Paroxysmal atrial fibrillation: Secondary | ICD-10-CM

## 2024-10-31 NOTE — Telephone Encounter (Signed)
 Called the patient and his wife, per DPR.  Left voicemail on patient's voicemail asking patient to call back.  Call back number provided.  Did not leave a message on wife's, unidentified voicemail.

## 2024-11-03 ENCOUNTER — Ambulatory Visit: Attending: Cardiology

## 2024-11-03 DIAGNOSIS — I480 Paroxysmal atrial fibrillation: Secondary | ICD-10-CM

## 2024-11-03 NOTE — Addendum Note (Signed)
 Addended by: BRIEN SALM on: 11/03/2024 08:50 AM   Modules accepted: Orders

## 2024-11-12 ENCOUNTER — Telehealth: Payer: Self-pay | Admitting: Pharmacy Technician

## 2024-11-12 NOTE — Telephone Encounter (Signed)
 Hi, we received this request in our fax but since we can not provide that sending to you guys to see if you can provide this for them. Thank you!     This is also scanned in media

## 2024-11-18 ENCOUNTER — Encounter: Payer: Self-pay | Admitting: Internal Medicine

## 2024-11-18 MED ORDER — PHENTERMINE HCL 30 MG PO CAPS: 30.0000 mg | ORAL_CAPSULE | ORAL | 0 refills | Status: DC

## 2024-11-23 ENCOUNTER — Ambulatory Visit: Payer: Self-pay | Admitting: Cardiology

## 2024-11-23 DIAGNOSIS — I480 Paroxysmal atrial fibrillation: Secondary | ICD-10-CM

## 2024-11-25 ENCOUNTER — Ambulatory Visit (INDEPENDENT_AMBULATORY_CARE_PROVIDER_SITE_OTHER): Admitting: Internal Medicine

## 2024-11-25 ENCOUNTER — Encounter: Payer: Self-pay | Admitting: Internal Medicine

## 2024-11-25 VITALS — BP 138/72 | Ht 69.0 in | Wt 276.0 lb

## 2024-11-25 DIAGNOSIS — R739 Hyperglycemia, unspecified: Secondary | ICD-10-CM | POA: Diagnosis not present

## 2024-11-25 DIAGNOSIS — F4329 Adjustment disorder with other symptoms: Secondary | ICD-10-CM | POA: Insufficient documentation

## 2024-11-25 DIAGNOSIS — E781 Pure hyperglyceridemia: Secondary | ICD-10-CM | POA: Diagnosis not present

## 2024-11-25 DIAGNOSIS — I471 Supraventricular tachycardia, unspecified: Secondary | ICD-10-CM | POA: Insufficient documentation

## 2024-11-25 DIAGNOSIS — I10 Essential (primary) hypertension: Secondary | ICD-10-CM | POA: Diagnosis not present

## 2024-11-25 DIAGNOSIS — Z125 Encounter for screening for malignant neoplasm of prostate: Secondary | ICD-10-CM | POA: Diagnosis not present

## 2024-11-25 DIAGNOSIS — Z0001 Encounter for general adult medical examination with abnormal findings: Secondary | ICD-10-CM | POA: Diagnosis not present

## 2024-11-25 MED ORDER — CITALOPRAM HYDROBROMIDE 10 MG PO TABS: 10.0000 mg | ORAL_TABLET | Freq: Every evening | ORAL | 1 refills | Status: AC

## 2024-11-25 MED ORDER — WEGOVY 4 MG PO TABS: 4.0000 mg | ORAL_TABLET | Freq: Every day | ORAL | 0 refills | Status: AC

## 2024-11-25 NOTE — Assessment & Plan Note (Signed)
 Encouraged diet and exercise for weight loss Will discontinue phentermine  given the fact that he has been having palpitations, runs of SVT in addition to his A-fib Rx for oral wegovy  4 mg daily

## 2024-11-25 NOTE — Patient Instructions (Signed)
 Supraventricular Tachycardia, Adult Supraventricular tachycardia (SVT) is a kind of abnormal heartbeat. It makes your heart beat very fast. This may last for just a short time. Or it may last longer and need treatment to get the heartbeat to go back to normal. A normal resting heartbeat is 60-100 times a minute. SVT can make your heart beat more than 150 times a minute.  The times when you have a fast heartbeat--or episodes of SVT--can be scary. But they're usually not dangerous. In some cases, SVT can lead to other heart problems. What are the causes?  SVT happens when electrical signals are sent out from areas of the upper part of the heart that don't normally send heartbeat signals. What increases the risk? You are more likely to get SVT if you are: Middle aged or older. Male. Other things that may increase your risk include: Having stress or feeling worried or nervous. Using illegal drugs such as cocaine or methamphetamine. Using over-the-counter cough or cold medicines. Stimulant drugs, such as caffeine. Smoking or alcohol use. Having any of these conditions: A thyroid condition. Diabetes. Obstructive sleep apnea. What are the signs or symptoms? A pounding heartbeat. Feeling fast or irregular heartbeats called palpitations. Shortness of breath. Weakness and tiredness. Tightness or pain in your chest. Feeling light-headed or dizzy. Feeling worried or nervous. Sometimes there are no symptoms. How is this diagnosed? This condition may be diagnosed based on: Your symptoms and a physical exam. Your health care provider will listen to your heart and feel your pulse. Tests. These may include: An electrocardiogram (ECG). This test checks for problems with electrical activity in the heart. A Holter monitor or event monitor test. For this test, you'll wear a device that monitors your heart rate over time. An echocardiogram. This test uses sound waves to make a picture of your  heart. A stress echocardiogram. An echocardiogram is done when you are at rest and after exercise. Blood tests. An electrophysiology study (EPS). This tests the electrical activity in your heart. It helps find where the abnormal heart rhythm is coming from. How is this treated? Treatment may include: Vagal nerve stimulation. Doing things to stimulate a nerve called the vagus nerve can slow down the heartbeat. Work with your provider to find which technique works best for you. Ways to do this include: Holding your breath and pushing, as though you are pooping. Putting gentle pressure on the neck. Do not try this yourself. Only a provider should do this. If done the wrong way, it can lead to a stroke. Medicines that prevent attacks. Medicine to stop an attack. This is given through an IV at the hospital. Cardioversion. This uses a small electric shock to stop an attack. Catheter ablation. This is a procedure to get rid of cells in the area that's causing the fast heartbeats. If you don't have symptoms, you may not need treatment. Follow these instructions at home: Stress Avoid things that make you feel stressed. Find healthy ways to deal with stress, such as: Doing yoga or meditation. Going out in nature. Listening to relaxing music. Taking steps to be healthy, such as getting lots of sleep, exercising, and eating a balanced diet. Talking with a mental health provider. Lifestyle Try to get at least 7 hours of sleep each night. Do not smoke, vape, or use products with nicotine or tobacco in them. If you need help quitting, talk with your provider. Do not use illegal drugs. If you need help quitting, talk with your provider. Do  not drink alcohol if it gives you a fast heartbeat. If alcohol does not seem to give you a fast heartbeat, limit your alcohol use. If you drink alcohol: Limit how much you have to: 0-1 drink a day if you are male. 0-2 drinks a day if you are male. Know how much  alcohol is in your drink. In the [place] one drink is one 12 oz bottle of beer (355 mL), one 5 oz glass of wine (148 mL), or one 1 oz glass of hard liquor (44 mL). Be aware of how caffeine affects you. If caffeine gives you a fast heartbeat, do not eat, drink, or use anything with caffeine in it. If caffeine doesn't seem to give you a fast heartbeat, limit how much caffeine you have. General instructions Stay at a healthy weight. Exercise often. Ask your provider about good activities for you. Try one or a mixture of these: 150 minutes a week of gentle exercise, like walking or yoga. 75 minutes a week of exercise that is very active, like running or swimming. Do vagal nerve stimulation only as told by your provider. Take medicines only as told by your provider. Keep all follow-up visits. Your provider will want to make sure your treatments are working. Contact a health care provider if: You have a fast heartbeat more often. The feeling that your heart is beating fast lasts longer than before. Home treatments to slow down your heartbeat do not help. You have new symptoms. Get help right away if: You have chest pain. Your heart beats very fast for more than 20 minutes. You have trouble breathing. You faint. Your symptoms get worse. These symptoms may be an emergency. Call 911 right away. Do not wait to see if the symptoms will go away. Do not drive yourself to the hospital. This information is not intended to replace advice given to you by your health care provider. Make sure you discuss any questions you have with your health care provider. Document Revised: 07/26/2023 Document Reviewed: 12/12/2022 Elsevier Patient Education  2024 [employer].

## 2024-11-25 NOTE — Progress Notes (Signed)
 "  Subjective:    Patient ID: Bradley Powers, male    DOB: 1966/05/02, 59 y.o.   MRN: [id number]  HPI  Patient presents to clinic today for his annual exam.  Of note, he has been having some palpitations.  He recently wore a heart monitor which showed multiple runs of SVT.  He has a history of A-fib managed on diltiazem  and aspirin  however recent cardiac monitor did not show any evidence of atrial fibrillation although he only wore the monitor for 4 days instead of 14 days because it would not stick.  He drinks 2 to 3 cups of caffeine per day.  He has been under a lot of stress lately.  Flu: 09/2016 Tetanus: < 10 years COVID: x 2 Shingrix: never PSA screening: 11/2023 Colon screening: never Vision screening: annually Dentist: biannually  Diet: He does eat meat. He consumes more veggies than fruits. He does eat fried foods. He drinks mostly water, dt soda. Exercise: None   Past Medical History:  Diagnosis Date   Acquired dilation of ascending aorta and aortic root    Arrhythmia    Arthritis    Chest pain    a. 12/2023 Cor CTA: Ca2+ = 0.  Nl cors.   Diastolic dysfunction    a. 01/2024 Echo: EF 55-60%, no rmwa, GrII DD, nl RV fxn, RVSP 23.5mmHg. Mild MR. Asc Ao 42mm.   Essential hypertension    Kidney stone    PAF (paroxysmal atrial fibrillation) (HCC)    a. Dx 2017--CHA2DS2VASc = 1 - no OAC.   Precordial pain    a. 12/2023 One episode of atypical chest pain with associated heart racing, weakness/numbness, and paleness. ED workup including EKG, troponin, D-dimer, and CT abdomen, chest, and pelvis unremarkable.   Spinal stenosis     Current Outpatient Medications  Medication Sig Dispense Refill   acetaminophen  (TYLENOL ) 325 MG tablet Take 650 mg by mouth every 6 (six) hours as needed.     aspirin  EC 81 MG tablet Take 81 mg by mouth daily. Swallow whole.     clobetasol  ointment (TEMOVATE ) 0.05 % Apply 1 Application topically 2 (two) times daily. 60 g 2   diltiazem  (CARDIZEM  CD)  240 MG 24 hr capsule Take 1 capsule (240 mg total) by mouth daily. 90 capsule 1   Multiple Vitamin (MULTIVITAMIN) capsule Take 1 capsule by mouth daily.     phentermine  30 MG capsule Take 1 capsule (30 mg total) by mouth every morning. 30 capsule 0   tamsulosin  (FLOMAX ) 0.4 MG CAPS capsule Take 1 capsule (0.4 mg total) by mouth daily. 90 capsule 1   topiramate  (TOPAMAX ) 25 MG tablet Take 1 tablet (25 mg total) by mouth 2 (two) times daily. 180 tablet 0   TURMERIC PO Take 1 capsule by mouth daily.     No current facility-administered medications for this visit.    No Known Allergies  Family History  Problem Relation Age of Onset   Lung cancer Mother    Heart attack Father    Healthy Sister    Healthy Brother     Social History   Socioeconomic History   Marital status: Married    Spouse name: Not on file   Number of children: Not on file   Years of education: Not on file   Highest education level: GED or equivalent  Occupational History   Occupation: human resources officer  Tobacco Use   Smoking status: Never   Smokeless tobacco: Never  Vaping Use  Vaping status: Never Used  Substance and Sexual Activity   Alcohol use: Yes    Alcohol/week: 0.0 standard drinks of alcohol    Comment: occ   Drug use: No   Sexual activity: Not on file  Other Topics Concern   Not on file  Social History Narrative   Married.   4 children.    Works in airline pilot.   Enjoys DJ's, playing in a band.    Social Drivers of Health   Tobacco Use: Low Risk (05/22/2024)   Patient History    Smoking Tobacco Use: Never    Smokeless Tobacco Use: Never    Passive Exposure: Not on file  Financial Resource Strain: Low Risk (11/24/2024)   Overall Financial Resource Strain (CARDIA)    Difficulty of Paying Living Expenses: Not hard at all  Food Insecurity: No Food Insecurity (11/24/2024)   Epic    Worried About Programme Researcher, Broadcasting/film/video in the Last Year: Never true    Ran Out of Food in the Last Year: Never true   Transportation Needs: No Transportation Needs (11/24/2024)   Epic    Lack of Transportation (Medical): No    Lack of Transportation (Non-Medical): No  Physical Activity: Unknown (05/21/2024)   Exercise Vital Sign    Days of Exercise per Week: Patient declined    Minutes of Exercise per Session: Not on file  Stress: Stress Concern Present (05/21/2024)   [employer] of Occupational Health - Occupational Stress Questionnaire    Feeling of Stress: To some extent  Social Connections: Unknown (11/24/2024)   Social Connection and Isolation Panel    Frequency of Communication with Friends and Family: More than three times a week    Frequency of Social Gatherings with Friends and Family: Three times a week    Attends Religious Services: Patient declined    Active Member of Clubs or Organizations: Patient declined    Attends Banker Meetings: Not on file    Marital Status: Married  Intimate Partner Violence: Not on file  Depression (PHQ2-9): Low Risk (05/22/2024)   Depression (PHQ2-9)    PHQ-2 Score: 2  Alcohol Screen: Low Risk (05/15/2023)   Alcohol Screen    Last Alcohol Screening Score (AUDIT): 2  Housing: Unknown (11/24/2024)   Epic    Unable to Pay for Housing in the Last Year: No    Number of Times Moved in the Last Year: Not on file    Homeless in the Last Year: No  Utilities: Not on file  Health Literacy: Not on file    ROS:  Constitutional: Denies fever, malaise, fatigue, headache or abrupt weight changes.  HEENT: Denies eye pain, eye redness, ear pain, ringing in the ears, wax buildup, runny nose, nasal congestion, bloody nose, or sore throat. Respiratory: Denies difficulty breathing, shortness of breath, cough or sputum production.   Cardiovascular: Patient reports palpitations.  Denies chest pain, chest tightness, or swelling in the hands or feet.  Gastrointestinal: Denies abdominal pain, bloating, constipation, diarrhea or blood in the stool.  GU: Denies  frequency, urgency, pain with urination, blood in urine, odor or discharge. Musculoskeletal: Patient reports intermittent joint pain.  Denies decrease in range of motion, difficulty with gait, muscle pain or joint swelling.  Skin: Denies redness, rashes, lesions or ulcercations.  Neurological: Pt reports insomnia. Denies dizziness, difficulty with memory, difficulty with speech or problems with balance and coordination.  Psych: Patient reports stress.  Denies anxiety, depression, SI/HI.  No other specific complaints in a complete  review of systems (except as listed in HPI above).  PE:  BP (!) 142/78 (BP Location: Left Arm, Patient Position: Sitting, Cuff Size: Large)   Ht 5' 9 (1.753 m)   Wt 276 lb (125.2 kg)   BMI 40.76 kg/m     Wt Readings from Last 3 Encounters:  05/22/24 264 lb 3.2 oz (119.8 kg)  01/31/24 255 lb 12.8 oz (116 kg)  12/20/23 260 lb (117.9 kg)    General: Appears his stated age, obese, in NAD. Skin: Warm, dry and intact. No rashes noted. HEENT: Head: normal shape and size; Eyes: sclera white, no icterus, conjunctiva pink, PERRLA and EOMs intact;  Neck: Trachea midline, no masses, lumps or thyromegaly noted. Cardiovascular: Normal rate and rhythm. S1,S2 noted.  No murmur, rubs or gallops noted. No JVD or BLE edema. No carotid bruits noted. Pulmonary/Chest: Normal effort and positive vesicular breath sounds. No respiratory distress. No wheezes, rales or ronchi noted.  Abdomen: Soft, nontender. Active bowel sounds.  Musculoskeletal: Joint deformity noted in hands. Strength 5/5 BUE/BLE. No difficulty with gait.  Neurological: Alert and oriented. Cranial nerves II-XII grossly intact. Coordination normal.  Psychiatric: Mood and affect normal. Behavior is normal. Judgment and thought content normal.     BMET    Component Value Date/Time   NA 142 12/20/2023 0928   K 3.5 12/20/2023 0928   CL 103 12/20/2023 0928   CO2 26 12/20/2023 0928   GLUCOSE 89 12/20/2023 0928    GLUCOSE 99 11/23/2023 0840   BUN 12 12/20/2023 0928   CREATININE 0.72 (L) 12/20/2023 0928   CREATININE 0.73 11/23/2023 0840   CALCIUM 8.5 (L) 12/20/2023 0928   GFRNONAA >60 09/24/2016 0551   GFRAA >60 09/24/2016 0551    Lipid Panel     Component Value Date/Time   CHOL 123 11/23/2023 0840   TRIG 151 (H) 11/23/2023 0840   HDL 28 (L) 11/23/2023 0840   CHOLHDL 4.4 11/23/2023 0840   LDLCALC 72 11/23/2023 0840    CBC    Component Value Date/Time   WBC 13.7 (H) 11/23/2023 0840   RBC 4.66 11/23/2023 0840   HGB 13.6 11/23/2023 0840   HCT 39.0 11/23/2023 0840   PLT 265 11/23/2023 0840   MCV 83.7 11/23/2023 0840   MCH 29.2 11/23/2023 0840   MCHC 34.9 11/23/2023 0840   RDW 12.9 11/23/2023 0840    Hgb A1C Lab Results  Component Value Date   HGBA1C 5.3 11/23/2023     Assessment and Plan:  Preventative health maintenance:  Flu shot declined Tetanus UTD per his report Encouraged him to get his COVID-vaccine Discussed Shingrix vaccine, he will check coverage with his insurance company and schedule visit if he would like to have this done He declines referral for colonoscopy or Cologuard at this time Encouraged him to consume a balanced diet and exercise regimen Advised him to see an eye doctor and dentist annually Will check CBC, c-Met, lipid, A1c and PSA today  RTC in 6 months for follow-up of chronic conditions Angeline Laura, NP  "

## 2024-11-25 NOTE — Assessment & Plan Note (Signed)
 Not currently on beta-blocker but should consider this if symptoms persist or worsen

## 2024-11-25 NOTE — Assessment & Plan Note (Signed)
 Will trial citalopram 10 mg daily Support offered

## 2024-11-25 NOTE — Assessment & Plan Note (Signed)
 Complicated by morbid obesity Initially elevated but manual repeat improved Continue diltiazem  240 mg daily Reinforced DASH diet and exercise for weight loss

## 2024-11-26 ENCOUNTER — Ambulatory Visit: Payer: Self-pay | Admitting: Internal Medicine

## 2024-11-26 LAB — COMPREHENSIVE METABOLIC PANEL WITH GFR
AG Ratio: 1.6 (calc) (ref 1.0–2.5)
ALT: 23 U/L (ref 9–46)
AST: 17 U/L (ref 10–35)
Albumin: 4.3 g/dL (ref 3.6–5.1)
Alkaline phosphatase (APISO): 105 U/L (ref 35–144)
BUN: 13 mg/dL (ref 7–25)
CO2: 29 mmol/L (ref 20–32)
Calcium: 8.9 mg/dL (ref 8.6–10.3)
Chloride: 105 mmol/L (ref 98–110)
Creat: 0.72 mg/dL (ref 0.70–1.30)
Globulin: 2.7 g/dL (ref 1.9–3.7)
Glucose, Bld: 104 mg/dL (ref 65–139)
Potassium: 4 mmol/L (ref 3.5–5.3)
Sodium: 142 mmol/L (ref 135–146)
Total Bilirubin: 0.3 mg/dL (ref 0.2–1.2)
Total Protein: 7 g/dL (ref 6.1–8.1)
eGFR: 107 mL/min/1.73m2

## 2024-11-26 LAB — HEMOGLOBIN A1C
Hgb A1c MFr Bld: 5.4 %
Mean Plasma Glucose: 108 mg/dL
eAG (mmol/L): 6 mmol/L

## 2024-11-26 LAB — LIPID PANEL
Cholesterol: 141 mg/dL
HDL: 41 mg/dL
LDL Cholesterol (Calc): 79 mg/dL
Non-HDL Cholesterol (Calc): 100 mg/dL
Total CHOL/HDL Ratio: 3.4 (calc)
Triglycerides: 116 mg/dL

## 2024-11-26 LAB — CBC
HCT: 40.6 % (ref 39.4–51.1)
Hemoglobin: 13.5 g/dL (ref 13.2–17.1)
MCH: 27.3 pg (ref 27.0–33.0)
MCHC: 33.3 g/dL (ref 31.6–35.4)
MCV: 82.2 fL (ref 81.4–101.7)
MPV: 9.2 fL (ref 7.5–12.5)
Platelets: 264 Thousand/uL (ref 140–400)
RBC: 4.94 Million/uL (ref 4.20–5.80)
RDW: 13.2 % (ref 11.0–15.0)
WBC: 6.8 Thousand/uL (ref 3.8–10.8)

## 2024-11-26 LAB — PSA: PSA: 1.19 ng/mL

## 2025-05-27 ENCOUNTER — Ambulatory Visit: Admitting: Internal Medicine
